# Patient Record
Sex: Female | Born: 1960
Health system: Southern US, Community
[De-identification: ages and names within clinical notes are randomized; demographics above are authoritative.]

---

## 1999-01-17 ENCOUNTER — Other Ambulatory Visit: Admission: RE | Admit: 1999-01-17 | Discharge: 1999-01-17 | Payer: Self-pay | Admitting: Gynecology

## 1999-01-23 ENCOUNTER — Ambulatory Visit: Admission: RE | Admit: 1999-01-23 | Discharge: 1999-01-23 | Payer: Self-pay | Admitting: Internal Medicine

## 1999-05-17 ENCOUNTER — Ambulatory Visit (HOSPITAL_BASED_OUTPATIENT_CLINIC_OR_DEPARTMENT_OTHER): Admission: RE | Admit: 1999-05-17 | Discharge: 1999-05-17 | Payer: Self-pay | Admitting: Otolaryngology

## 2000-12-06 ENCOUNTER — Encounter: Payer: Self-pay | Admitting: Gynecology

## 2000-12-06 ENCOUNTER — Encounter: Admission: RE | Admit: 2000-12-06 | Discharge: 2000-12-06 | Payer: Self-pay | Admitting: Gynecology

## 2000-12-11 ENCOUNTER — Encounter: Admission: RE | Admit: 2000-12-11 | Discharge: 2000-12-11 | Payer: Self-pay | Admitting: Gynecology

## 2000-12-11 ENCOUNTER — Encounter: Payer: Self-pay | Admitting: Gynecology

## 2001-06-05 ENCOUNTER — Encounter: Payer: Self-pay | Admitting: Gynecology

## 2001-06-05 ENCOUNTER — Encounter: Admission: RE | Admit: 2001-06-05 | Discharge: 2001-06-05 | Payer: Self-pay | Admitting: Gynecology

## 2001-10-08 ENCOUNTER — Other Ambulatory Visit: Admission: RE | Admit: 2001-10-08 | Discharge: 2001-10-08 | Payer: Self-pay | Admitting: Gynecology

## 2001-12-09 ENCOUNTER — Encounter: Payer: Self-pay | Admitting: Gynecology

## 2001-12-09 ENCOUNTER — Encounter: Admission: RE | Admit: 2001-12-09 | Discharge: 2001-12-09 | Payer: Self-pay | Admitting: Gynecology

## 2002-10-05 ENCOUNTER — Encounter: Payer: Self-pay | Admitting: Family Medicine

## 2002-10-05 ENCOUNTER — Encounter: Admission: RE | Admit: 2002-10-05 | Discharge: 2002-10-05 | Payer: Self-pay | Admitting: Family Medicine

## 2002-10-12 ENCOUNTER — Encounter (HOSPITAL_COMMUNITY): Admission: RE | Admit: 2002-10-12 | Discharge: 2003-01-10 | Payer: Self-pay | Admitting: General Surgery

## 2002-10-12 ENCOUNTER — Other Ambulatory Visit: Admission: RE | Admit: 2002-10-12 | Discharge: 2002-10-12 | Payer: Self-pay | Admitting: Gynecology

## 2002-10-13 ENCOUNTER — Encounter: Payer: Self-pay | Admitting: Endocrinology

## 2002-12-14 ENCOUNTER — Encounter: Payer: Self-pay | Admitting: Gynecology

## 2002-12-14 ENCOUNTER — Encounter: Admission: RE | Admit: 2002-12-14 | Discharge: 2002-12-14 | Payer: Self-pay | Admitting: Gynecology

## 2002-12-17 ENCOUNTER — Ambulatory Visit (HOSPITAL_COMMUNITY): Admission: RE | Admit: 2002-12-17 | Discharge: 2002-12-17 | Payer: Self-pay | Admitting: Endocrinology

## 2002-12-17 ENCOUNTER — Encounter: Payer: Self-pay | Admitting: Endocrinology

## 2003-09-14 ENCOUNTER — Encounter (INDEPENDENT_AMBULATORY_CARE_PROVIDER_SITE_OTHER): Payer: Self-pay | Admitting: Specialist

## 2003-09-14 ENCOUNTER — Observation Stay (HOSPITAL_COMMUNITY): Admission: RE | Admit: 2003-09-14 | Discharge: 2003-09-15 | Payer: Self-pay | Admitting: Gynecology

## 2003-12-16 ENCOUNTER — Encounter: Admission: RE | Admit: 2003-12-16 | Discharge: 2003-12-16 | Payer: Self-pay | Admitting: Gynecology

## 2003-12-22 ENCOUNTER — Encounter: Admission: RE | Admit: 2003-12-22 | Discharge: 2003-12-22 | Payer: Self-pay | Admitting: Gynecology

## 2005-01-02 ENCOUNTER — Other Ambulatory Visit: Admission: RE | Admit: 2005-01-02 | Discharge: 2005-01-02 | Payer: Self-pay | Admitting: Gynecology

## 2005-01-08 ENCOUNTER — Encounter: Admission: RE | Admit: 2005-01-08 | Discharge: 2005-01-08 | Payer: Self-pay | Admitting: Gynecology

## 2005-01-09 ENCOUNTER — Encounter: Admission: RE | Admit: 2005-01-09 | Discharge: 2005-01-09 | Payer: Self-pay | Admitting: Family Medicine

## 2006-01-22 ENCOUNTER — Encounter: Admission: RE | Admit: 2006-01-22 | Discharge: 2006-01-22 | Payer: Self-pay | Admitting: Gynecology

## 2006-02-14 ENCOUNTER — Other Ambulatory Visit: Admission: RE | Admit: 2006-02-14 | Discharge: 2006-02-14 | Payer: Self-pay | Admitting: Gynecology

## 2007-02-04 ENCOUNTER — Encounter: Admission: RE | Admit: 2007-02-04 | Discharge: 2007-02-04 | Payer: Self-pay | Admitting: Gynecology

## 2007-02-24 ENCOUNTER — Other Ambulatory Visit: Admission: RE | Admit: 2007-02-24 | Discharge: 2007-02-24 | Payer: Self-pay | Admitting: Gynecology

## 2008-02-05 ENCOUNTER — Encounter: Admission: RE | Admit: 2008-02-05 | Discharge: 2008-02-05 | Payer: Self-pay | Admitting: Gynecology

## 2009-02-22 ENCOUNTER — Encounter: Admission: RE | Admit: 2009-02-22 | Discharge: 2009-02-22 | Payer: Self-pay | Admitting: Gynecology

## 2010-01-20 ENCOUNTER — Encounter: Payer: Self-pay | Admitting: Family Medicine

## 2010-03-15 ENCOUNTER — Ambulatory Visit: Payer: Self-pay | Admitting: Vascular Surgery

## 2010-03-23 ENCOUNTER — Encounter: Admission: RE | Admit: 2010-03-23 | Discharge: 2010-03-23 | Payer: Self-pay | Admitting: Gynecology

## 2010-03-29 ENCOUNTER — Encounter: Admission: RE | Admit: 2010-03-29 | Discharge: 2010-03-29 | Payer: Self-pay | Admitting: Gynecology

## 2011-03-06 NOTE — Consult Note (Signed)
NEW PATIENT CONSULTATION   [name], [name]  DOB:  [birth year]                                       03/15/2010  ZOXWR#:[id number]   The patient presents today for evaluation of lower extremity venous  pathology.  She has significant telangiectasia and reticular veins  bilaterally, more so on her right leg than her left.  This is mostly in  the lateral thighs.  She reports that she has discomfort over these  areas with throbbing and dull ache over these areas as well.  She also  reports swelling in her calves and ankles with prolonged standing.  She  has worn compression garments since January of 2011 with mild relief.  She does not have any history of deep venous thrombosis or bleeding from  the varices and no history of superficial thrombophlebitis.  She reports  that the pain is present with or without standing or sitting.   Her past medical history is significant for prior tonsillectomy, C-  section, turbinate reduction and hysterectomy in 2004.  She does have  history of hypertension.   She has severe allergy to lisinopril.   Family history is negative for premature atherosclerotic disease.   SOCIAL HISTORY:  She is married with two children.  She is an Designer, jewellery.  She does not smoke or drink alcohol.   REVIEW OF SYSTEMS:  She has no weight loss or weight gain.  Her weight  is 225 pounds.  She is 5 feet 9 inches tall.  Remaining review of  systems is negative as documented in her chart.   PHYSICAL EXAM:  General:  A well-developed, well-nourished white female  in no acute distress.  HEENT:  Normal.  Her radial and dorsalis pedis  pulses are 2+ bilaterally.  Musculoskeletal:  Shows no major deformities  or cyanosis.  Neurological:  No focal weakness or paresthesias.  Skin:  Without ulcers or rashes.  She does not have any prominent varicose  veins.  She does have reticular veins and spider vein most prominently  in her lateral thighs.   She  underwent noninvasive vascular laboratory studies in our office and  I reviewed this with her.  This shows no evidence of deep venous  thrombosis.  No evidence of valvular incompetence in her deep or  superficial veins.  I discussed this at length with the patient and  explained to her that she does not have any serious venous pathology  that would require treatment.  I explained that her leg aching may or  may not be related to the reticular veins and telangiectasia.  I  explained that the treatment for these would be sclerotherapy and this  would normally be more for cosmesis.  This could be related to the  discomfort and she may achieve a side benefit from this as well.  Plan  to see her on an as-needed basis and she will notify us should she wish  to pursue sclerotherapy.     Larina Earthly, M.D.  Electronically Signed   TFE/MEDQ  D:  03/15/2010  T:  03/16/2010  Job:  [id number]   cc:   Molly Maduro L. Foy [place], M.D.  Katrina Stack, NP

## 2011-03-06 NOTE — Procedures (Signed)
LOWER EXTREMITY VENOUS REFLUX EXAM   INDICATION:  Right lower extremity varicose veins.   EXAM:  Using color-flow imaging and pulse Doppler spectral analysis, the  right common femoral, superficial femoral, popliteal, posterior tibial,  greater and lesser saphenous veins are evaluated.  There is no evidence  suggesting deep venous insufficiency in the right lower extremity.   The right saphenofemoral junction is competent. The right GSV is  competent with the caliber as described below.   The right proximal short saphenous vein demonstrates competency.   There are no diameters.   GSV Diameter (used if found to be incompetent only)                                            Right    Left  Proximal Greater Saphenous Vein           cm       cm  Proximal-to-mid-thigh                     cm       cm  Mid thigh                                 cm       cm  Mid-distal thigh                          cm       cm  Distal thigh                              cm       cm  Knee                                      cm       cm   IMPRESSION:  1. The right greater saphenous vein is competent.  2. The right greater saphenous vein is not aneurysmal.  3. The right greater saphenous vein is not tortuous.  4. The deep venous system is competent.  5. The right lesser saphenous vein is competent.        ___________________________________________  Larina Earthly, M.D.   NT/MEDQ  D:  03/15/2010  T:  03/15/2010  Job:  [id number]

## 2011-03-09 NOTE — Op Note (Signed)
NAME:  [name], [name]                          ACCOUNT NO.:  [id number]   MEDICAL RECORD NO.:  [id number]                   PATIENT TYPE:  OBV   LOCATION:  9399                                 FACILITY:  [facility]   PHYSICIAN:  Gretta Cool, M.D.              DATE OF BIRTH:  1961-05-11   DATE OF PROCEDURE:  09/14/2003  DATE OF DISCHARGE:                                 OPERATIVE REPORT   PREOPERATIVE DIAGNOSES:  1. Abnormal uterine bleeding with multiple uterine leiomyomata and     endometrial polyps.  2. Menstrual migraine, incapacitating.   POSTOPERATIVE DIAGNOSES:  1. Abnormal uterine bleeding with multiple uterine leiomyomata and     endometrial polyps.  2. Menstrual migraine, incapacitating.   PROCEDURE:  1. Laparoscopy assisted vaginal hysterectomy.  2. Bilateral salpingo-oophorectomy.   SURGEON:  Gretta Cool, M.D.   ASSISTANT:  Raynald Kemp, M.D.   ANESTHESIA:  General orotracheal.   DESCRIPTION OF PROCEDURE:  Under excellent anesthesia as above with the  patient prepped and draped in Allen stirrups with a Foley catheter draining  her bladder, a subumbilical incision was made and Veress cannula introduced.  After adequate pneumoperitoneum with __________, laparoscopic trocar was  introduced and pelvic organs visualized.  The fallopian tubes and ovaries  appeared normal.  There was minimal endometriosis in the cul-de-sac treated  by cautery.  No evidence of ___________ disease anywhere else.  There were  multiple fibroids distorting the endometrial cavity and the uterus.  At this  point attention was turned to the laparoscopic port placement.  Two port  sites were placed laterally under direct vision with careful avoidance of  major structures.  A tripolar forceps 5 mm was introduced and the  infundibulopelvic vessels coagulated and transected.  The pedicles were then  progressively transected all the way to the level of the uterine vessels.  The bladder was not  significantly advanced from her previous cesarean  section.  There were no adhesions from her section.  At this point attention  was turned to the vaginal portion of the procedure.  The patient was  repositioned and cervix grasped with a single tooth tenaculum.  A weighted  speculum was placed and the cervical mucosa infiltrated with Xylocaine 1%  with 1:200 epinephrine.  The mucosa was then incised and pushed off the  lower segment.  The cul-de-sac was then entered.  The uterosacral and then  cardinal ligaments were progressively clamped, cut, sutured, and tied with 0  Vicryl.  They were tagged for later identification.  The uterine vessels  were then skeletonized, bladder peritoneum opened, and the uterine vessels  clamped, cut, sutured, and tied with 0 Vicryl.  At this point the upper  portion of the pedicle was communicated to the lower dissection and the  uterus inverted and removed along with tubes and ovaries.  At this point the  peritoneum was closed with a running purse-string  closure of 0 Monocryl.  A  cardinal uterosacral colposuspension was then performed using 0 Novofil.  The cuff was then approximated with a running subcuticular closure of 2-0  Vicryl.  At this point the procedure was terminated without complications.  The pedicles were reexamined abdominally and the pelvic floor irrigated with  lactated Ringer's.  The pedicles were all dry.  At this  point the gas was allowed to escape and the incision closed with deep suture  of 5-0 Dexon and skin closure with Steri-Strips.  At the end of the  procedure the sponge and lap counts were correct.  There were no  complications.  The patient returned to recovery room in excellent  condition.                                               Gretta Cool, M.D.    CWL/MEDQ  D:  09/14/2003  T:  09/14/2003  Job:  [id number]   cc:   Izola Price, M.D.  [employer] [employer]

## 2011-03-09 NOTE — Discharge Summary (Signed)
NAME:  [name], LOUGHMILLER                          ACCOUNT NO.:  [id number]   MEDICAL RECORD NO.:  [id number]                   PATIENT TYPE:  OBV   LOCATION:  9312                                 FACILITY:  [facility]   PHYSICIAN:  Gretta Cool, M.D.              DATE OF BIRTH:  01/07/1961   DATE OF ADMISSION:  09/14/2003  DATE OF DISCHARGE:  09/15/2003                                 DISCHARGE SUMMARY   HISTORY OF PRESENT ILLNESS:  Ms. Riggle is a 50 year old white female,  gravida 2 para 3 with a C-section for twins.  She presented to our office  with abnormal uterine bleeding and polyps by ultrasound.  She also has  severe menstrual migraines and wishes definitive therapy by hysterectomy and  BSO.   PHYSICAL EXAMINATION:  HEART:  Rate and rhythm are regular without murmur,  gallop, cardiac enlargement.  LUNGS:  Bilaterally clear to A&P.  ABDOMEN:  Large panniculus without palpable masses or organomegaly.  GENITALIA:  External genitalia within normal limits for female.  PELVIC:  Clean and rugose.  Cervix is parous and clean.  Uterus upper normal  limits in size.  Adnexa bilaterally clear.  Rectovaginal exam confirms.   IMPRESSION:  1. Abnormal uterine bleeding.  2. Menstrual migraines.  3. Polyps confirmed on ultrasound - endometrial polyps.   PLAN:  LAVH-BSO, possible TAH-BSO.   LABORATORY DATA:  EKG:  Normal sinus rhythm, heart rate of 73.   Admission hemoglobin 14.1, hematocrit 4.1. and white count of 7.0.  On the  first postoperative day hemoglobin was 12.2, hematocrit 35.3.  The remainder  of her preoperative lab work was within normal limits.  Serum pregnancy test  was negative.   HOSPITAL COURSE:  The patient underwent laparoscopic assisted vaginal  hysterectomy, bilateral salpingo-oophorectomy under general anesthesia.  The  procedures were completed without any complications, and the patient was  returned to the recovery room in excellent condition.   Pathology  report revealed benign cervix with colonic cervicitis, benign  proliferative phase endometrium, leiomyoma and focal adenomyosis.  Bilateral  ovaries and tubes with benign hemorrhagic follicular cysts bilaterally.   POSTOPERATIVE COURSE:  Without complication and the patient was discharged  on the first postoperative day in excellent condition.   FINAL DISCHARGE INSTRUCTIONS:  Included:  1. No heavy lifting or straining.  2. No vaginal entrance.  3. Increase ambulation as tolerated.  4. She is to call for any fever of over 100.5 or failure of daily     improvement.   DIET:  Regular.   MEDICATIONS:  1. Tylox one p.o. q.4h. p.r.n. discomfort.  2. Bextra 10 mg p.o. daily for less severe pain.   She is to return to the office in one week for followup.   CONDITION ON DISCHARGE:  Excellent.   FINAL DISCHARGE DIAGNOSIS:  1. Abnormal uterine bleeding with multiple uterine leiomyomata and     endometrial polyps.  2. Menstrual migraines, incapacitating.   PROCEDURES PERFORMED:  1. Laparoscopic assisted vaginal hysterectomy, under general anesthesia.  2. Bilateral salpingo-oophorectomy, under general anesthesia.     Matt Holmes, N.P.                          Gretta Cool, M.D.    EMK/MEDQ  D:  10/12/2003  T:  10/12/2003  Job:  [id number]

## 2011-04-09 ENCOUNTER — Other Ambulatory Visit: Payer: Self-pay | Admitting: Gynecology

## 2011-04-09 DIAGNOSIS — Z1231 Encounter for screening mammogram for malignant neoplasm of breast: Secondary | ICD-10-CM

## 2011-04-18 ENCOUNTER — Ambulatory Visit: Payer: Self-pay

## 2011-04-26 ENCOUNTER — Ambulatory Visit
Admission: RE | Admit: 2011-04-26 | Discharge: 2011-04-26 | Disposition: A | Discharge status: Home / Self Care | Payer: Commercial Managed Care - PPO | Source: Ambulatory Visit | Attending: Gynecology | Admitting: Gynecology

## 2011-04-26 DIAGNOSIS — Z1231 Encounter for screening mammogram for malignant neoplasm of breast: Secondary | ICD-10-CM

## 2011-06-13 ENCOUNTER — Other Ambulatory Visit: Payer: Self-pay | Admitting: Gynecology

## 2011-08-06 IMAGING — MG MM DIGITAL SCREENING
4 series · 4 of 4 positions shown · non-contrast
Comparison: none

DG SCREEN MAMMOGRAM BILATERAL
Bilateral CC and MLO view(s) were taken.

DIGITAL SCREENING MAMMOGRAM WITH CAD:
There are scattered fibroglandular densities.  A possible mass is noted in the left breast.  Spot 
compression views and possibly sonography are recommended for further evaluation.  In the right 
breast, no masses or malignant type calcifications are identified.  Compared with prior studies.
Images were processed with CAD.

[R CC]
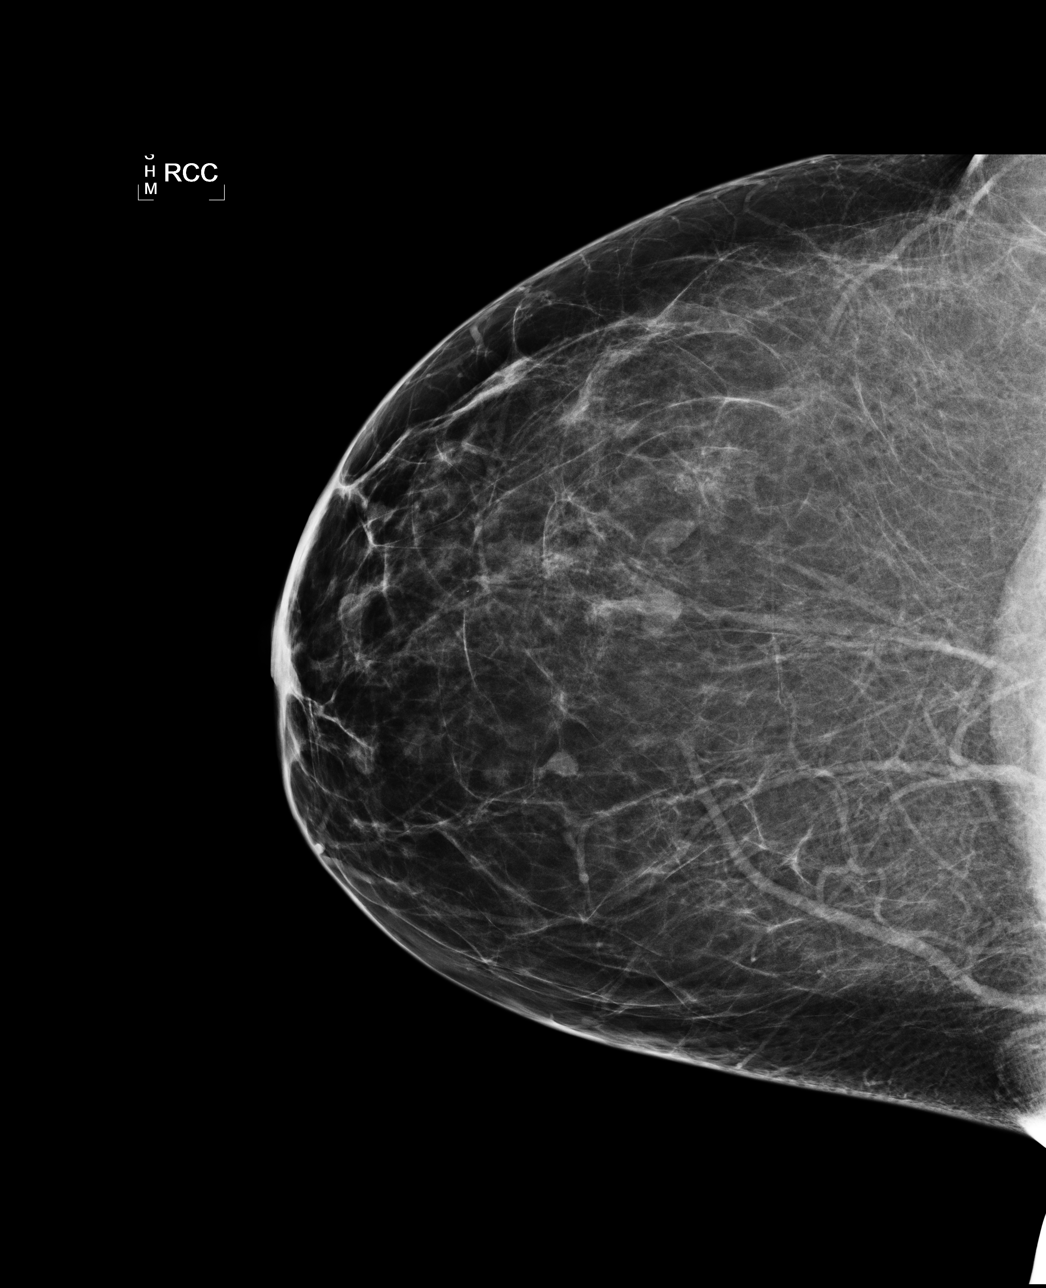

[L CC]
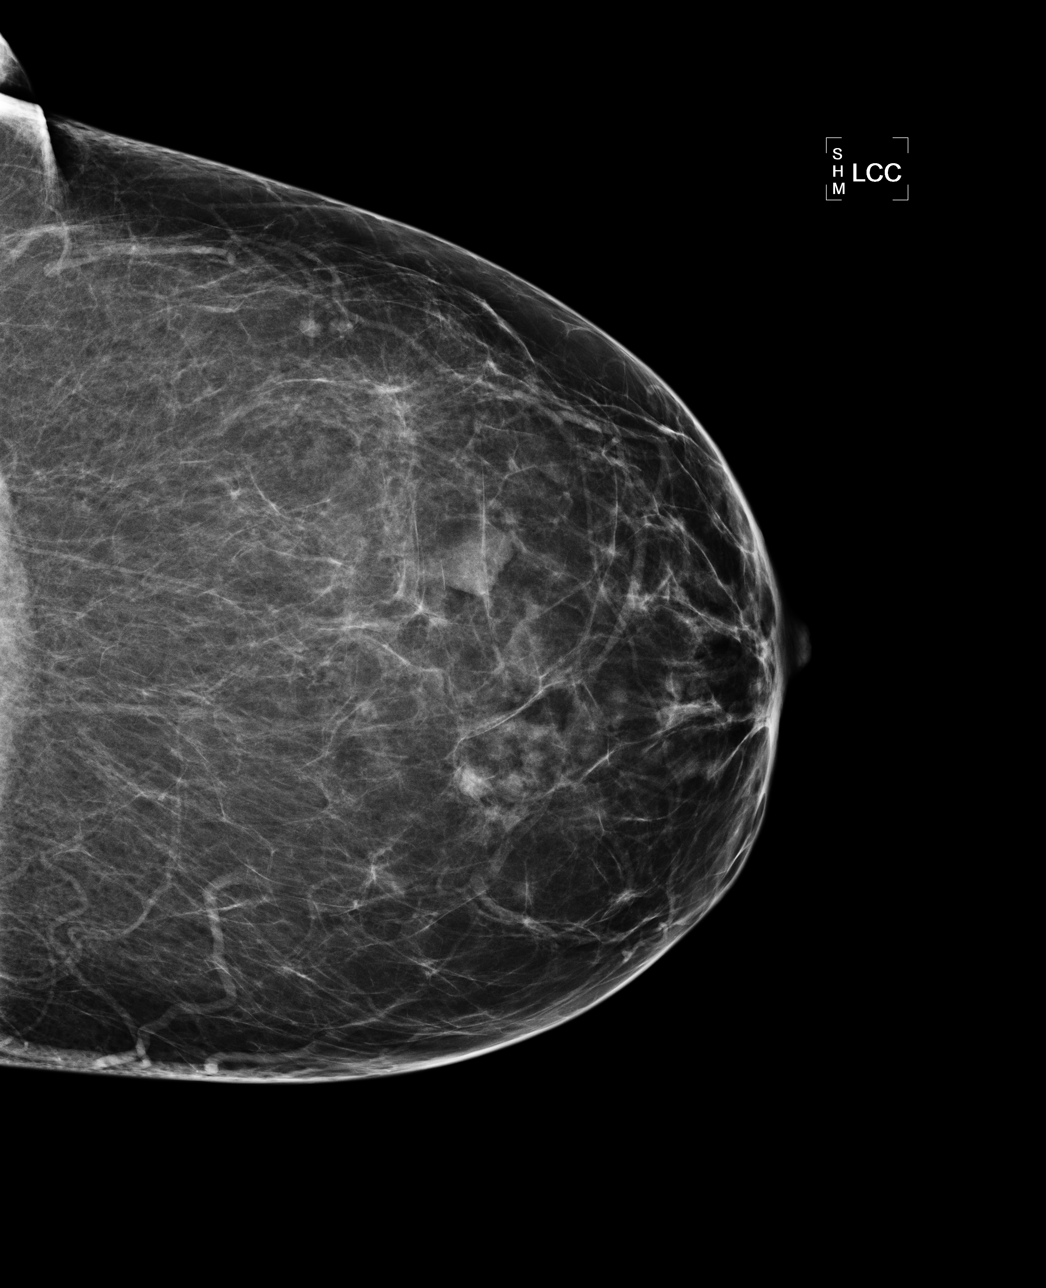

[L MLO]
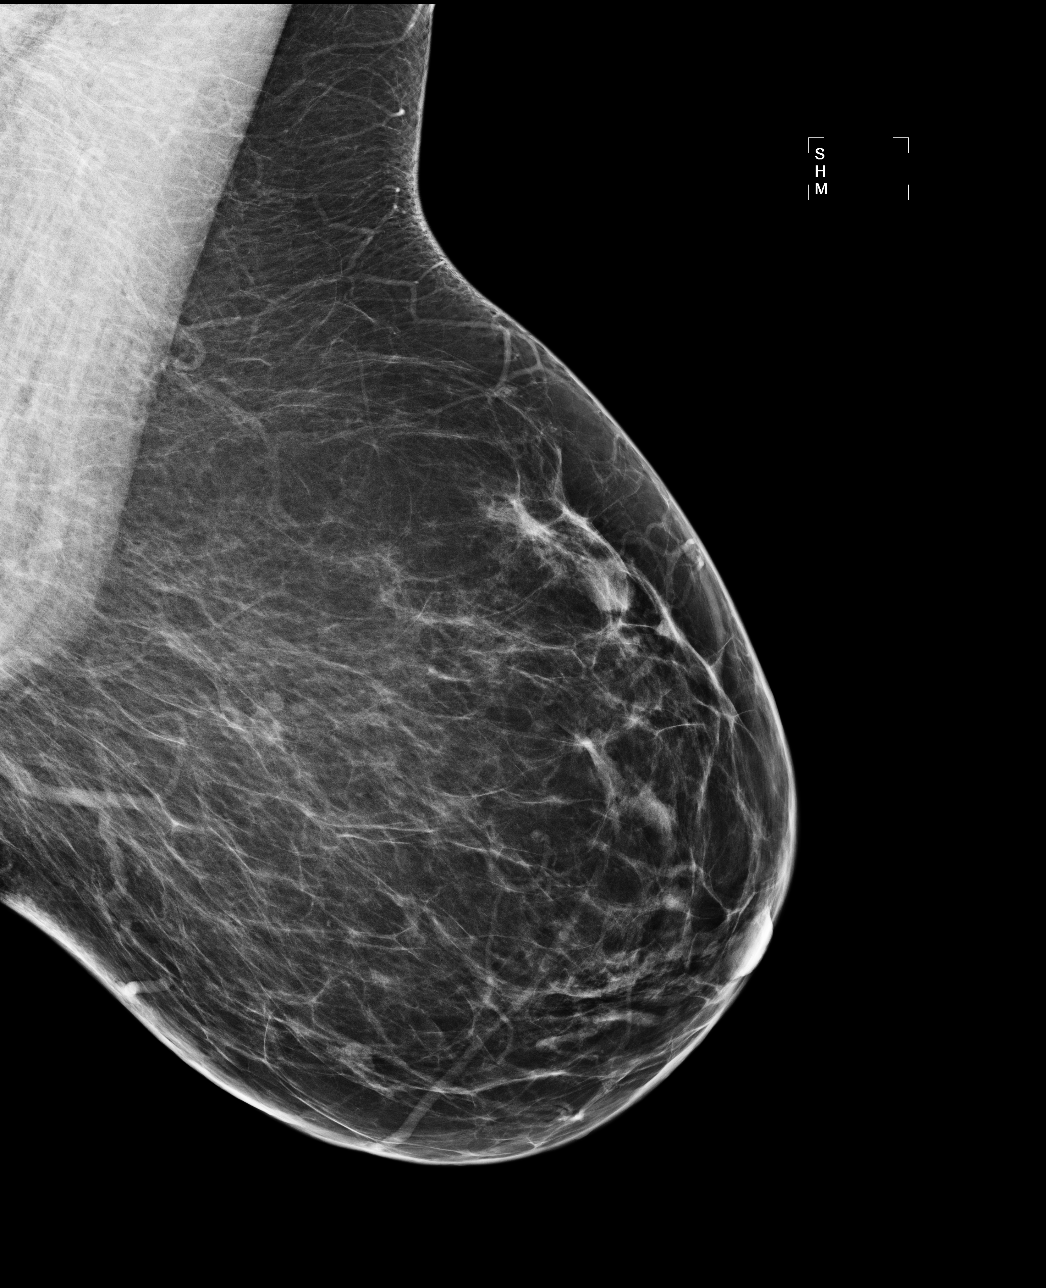

[R MLO]
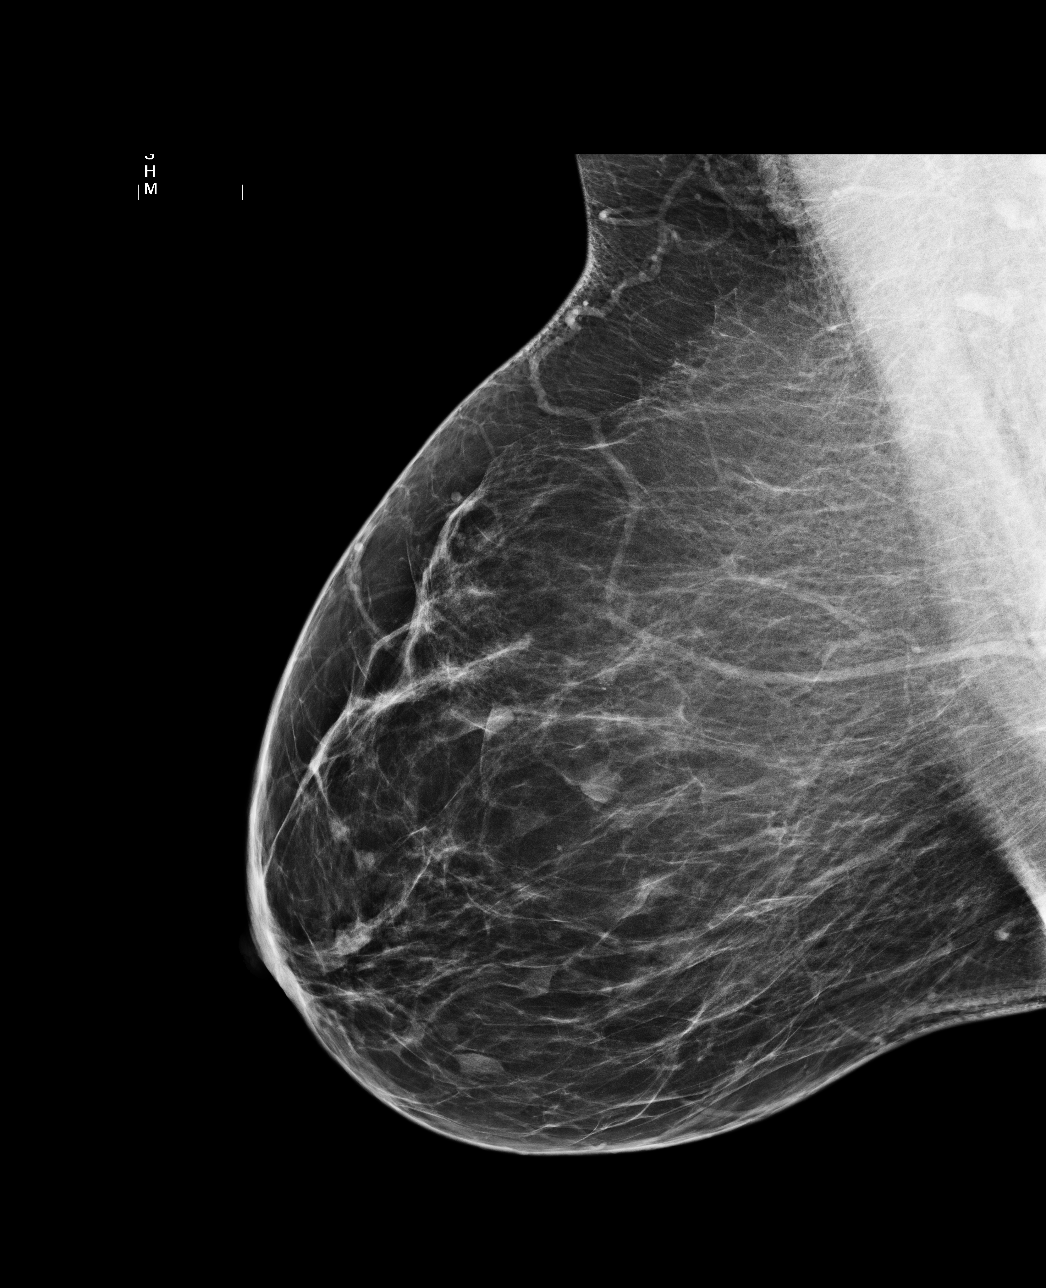

[4 of 4 positions shown; findings below may reference images not displayed]

IMPRESSION: Possible mass, left breast.  Additional evaluation is indicated.  The patient will be contacted for
additional studies and a supplementary report will follow.  No specific mammographic evidence of 
malignancy, right breast.

ASSESSMENT: Need additional imaging evaluation and/or prior mammograms for comparison - BI-RADS 0

Further imaging of the left breast.
,

## 2012-05-16 ENCOUNTER — Other Ambulatory Visit: Payer: Self-pay | Admitting: Gynecology

## 2012-05-16 DIAGNOSIS — Z1231 Encounter for screening mammogram for malignant neoplasm of breast: Secondary | ICD-10-CM

## 2012-05-26 ENCOUNTER — Ambulatory Visit: Payer: Commercial Managed Care - PPO

## 2012-06-09 ENCOUNTER — Ambulatory Visit
Admission: RE | Admit: 2012-06-09 | Discharge: 2012-06-09 | Disposition: A | Discharge status: Home / Self Care | Payer: Commercial Managed Care - PPO | Source: Ambulatory Visit | Attending: Gynecology | Admitting: Gynecology

## 2012-06-09 DIAGNOSIS — Z1231 Encounter for screening mammogram for malignant neoplasm of breast: Secondary | ICD-10-CM

## 2012-06-17 ENCOUNTER — Other Ambulatory Visit: Payer: Self-pay | Admitting: Gynecology

## 2013-06-03 ENCOUNTER — Other Ambulatory Visit: Payer: Self-pay

## 2013-06-03 DIAGNOSIS — Z1231 Encounter for screening mammogram for malignant neoplasm of breast: Secondary | ICD-10-CM

## 2013-06-16 ENCOUNTER — Ambulatory Visit
Admission: RE | Admit: 2013-06-16 | Discharge: 2013-06-16 | Disposition: A | Discharge status: Home / Self Care | Payer: 59 | Source: Ambulatory Visit

## 2013-06-16 DIAGNOSIS — Z1231 Encounter for screening mammogram for malignant neoplasm of breast: Secondary | ICD-10-CM

## 2013-10-24 IMAGING — MG MM DIGITAL SCREENING BILAT
8 series · 8 of 24 positions shown · non-contrast
Comparison: Previous exams.

CLINICAL DATA: Screening.

DIGITAL BILATERAL SCREENING MAMMOGRAM WITH CAD
 DIGITAL BREAST TOMOSYNTHESIS
Digital breast tomosynthesis images are acquired in two
projections.  These images are reviewed in combination with the
digital mammogram, confirming the findings below.

[R MLO]
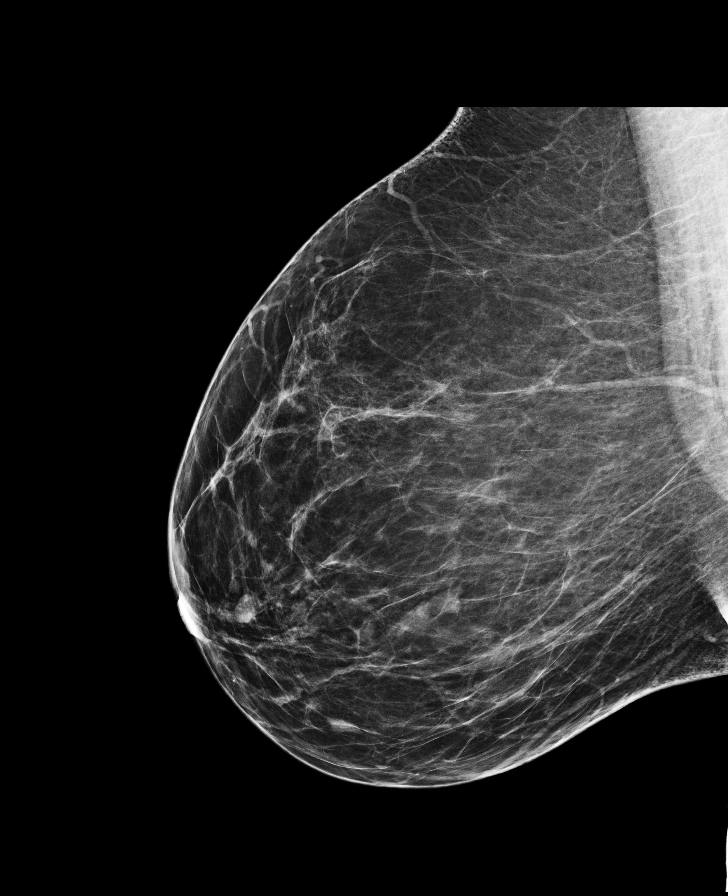

[L MLO]
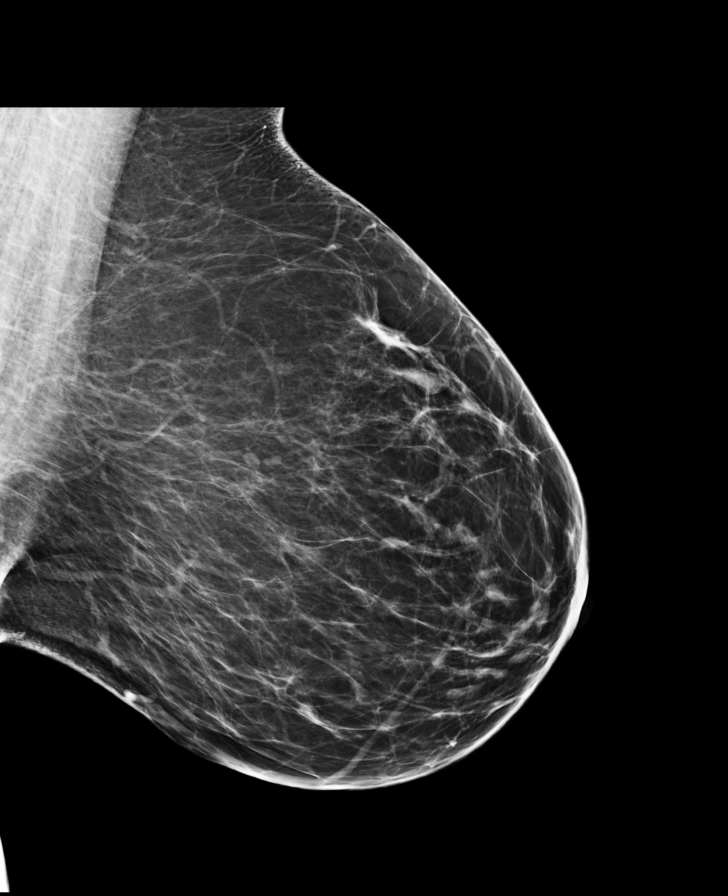

[R CC]
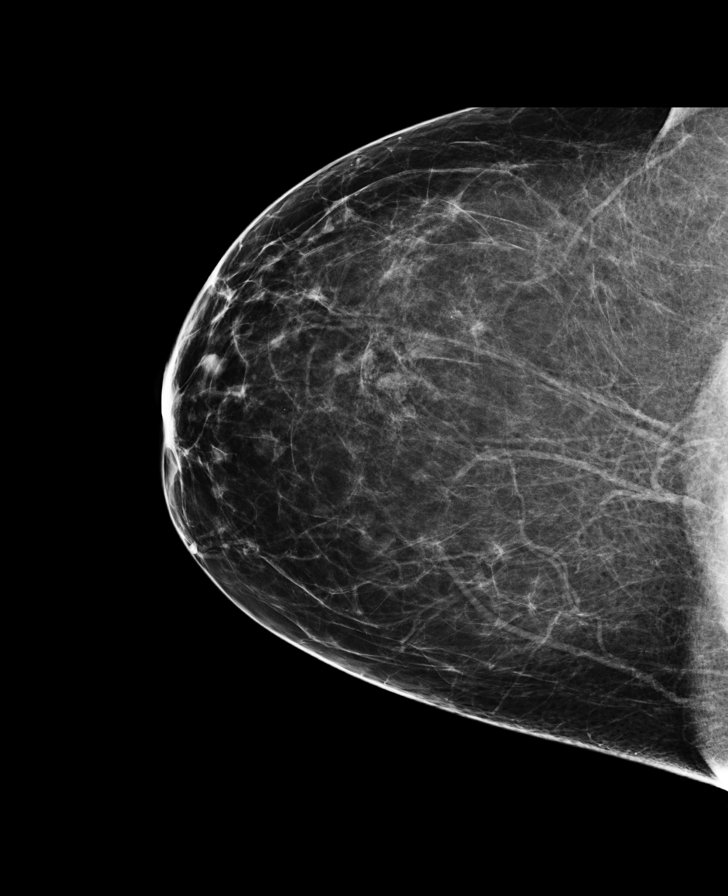

[L CC]
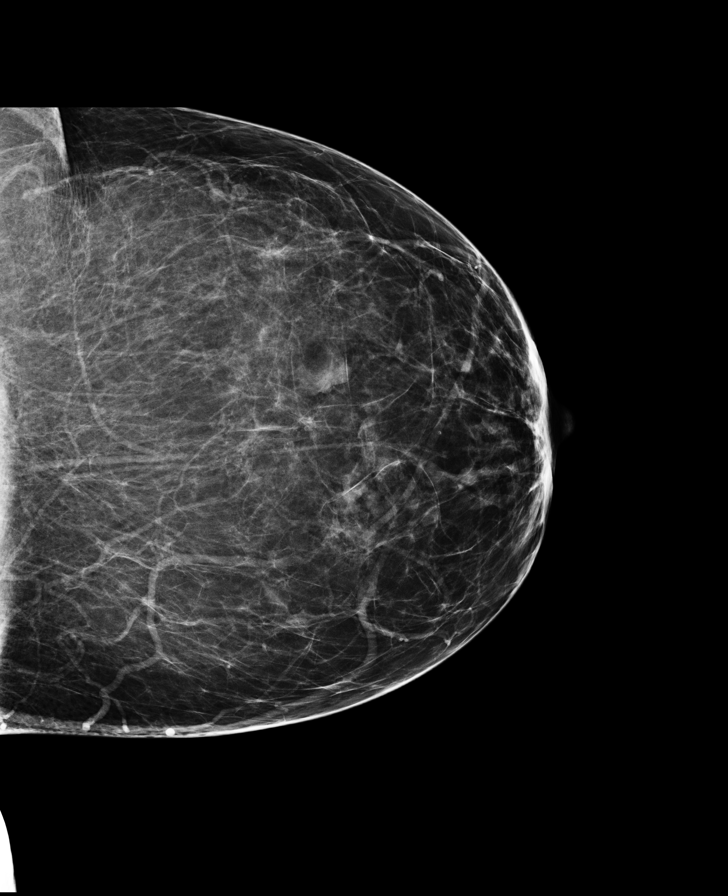

[LCC COMBO BREAST TOMOSYNTHESIS IMAGE tomo · tomo slice 37/74.0]
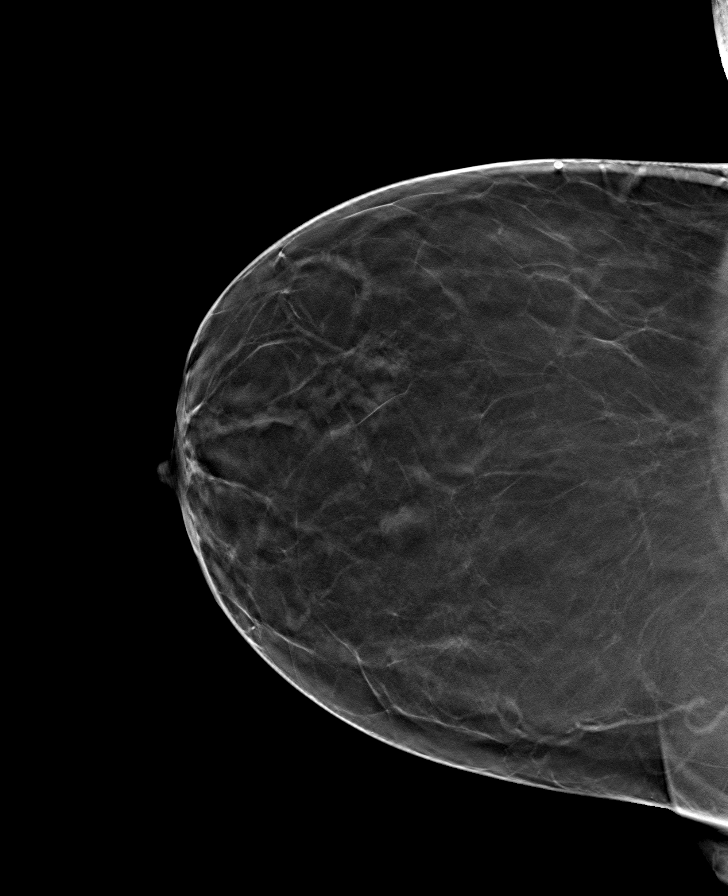

[RMLO COMBO BREAST TOMOSYNTHESIS IMAGE tomo · tomo slice 38/75.0]
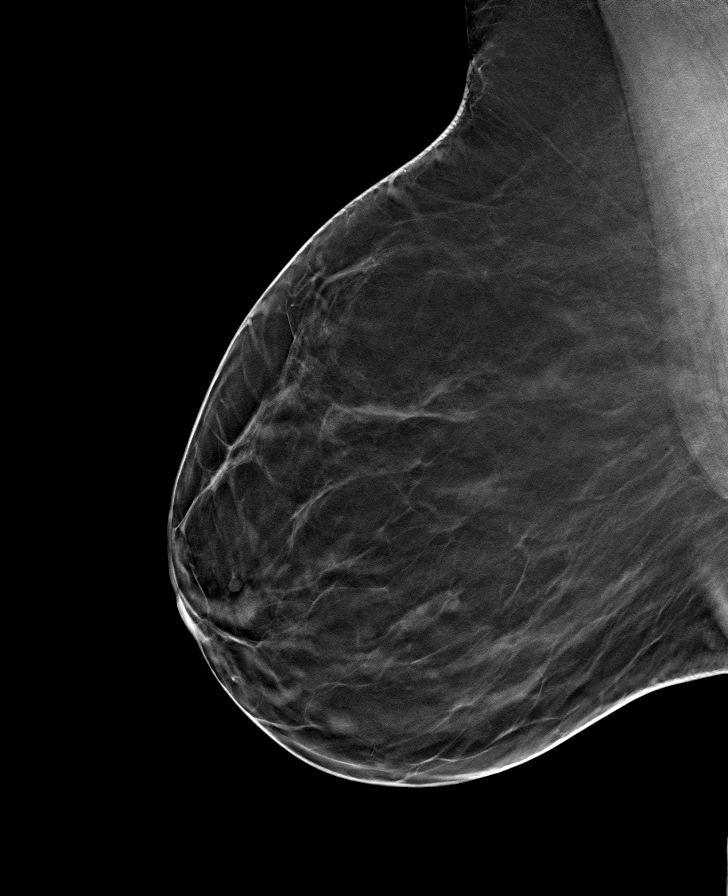

[RCC COMBO BREAST TOMOSYNTHESIS IMAGE tomo · tomo slice 37/74.0]
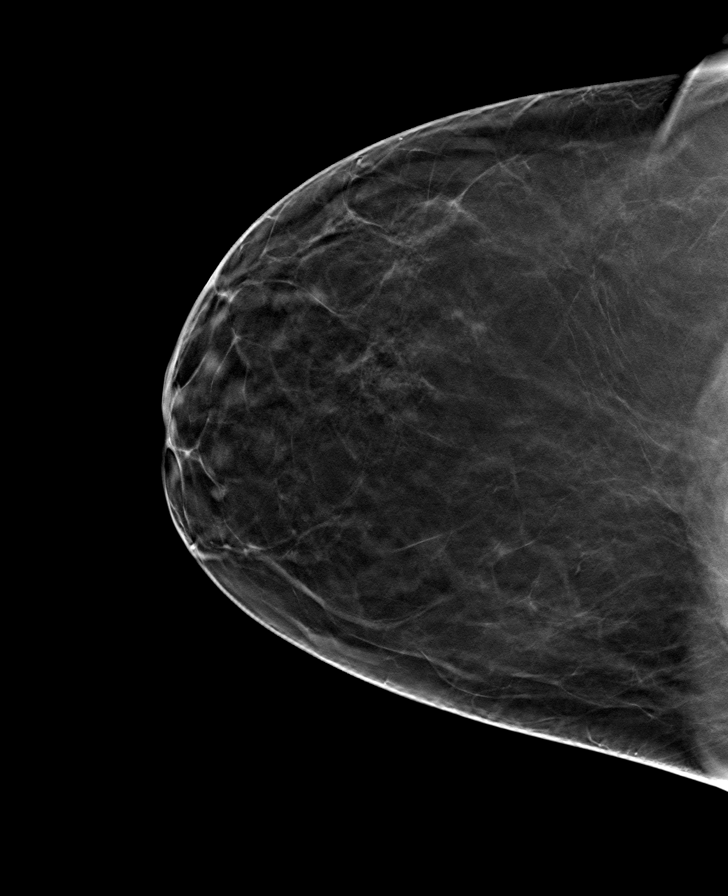

[LMLO COMBO BREAST TOMOSYNTHESIS IMAGE tomo · tomo slice 41/81.0]
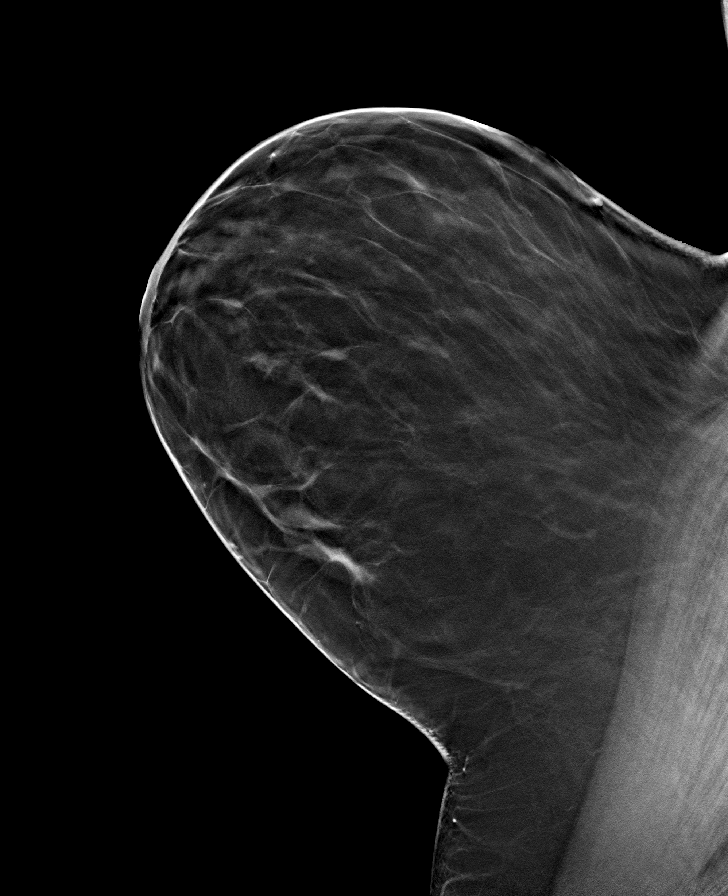

[8 of 24 positions shown; findings below may reference images not displayed]

FINDINGS: There are scattered fibroglandular densities. No
suspicious masses, architectural distortion, or calcifications are
present.

Images were processed with CAD.
IMPRESSION: No mammographic evidence of malignancy.

A result letter of this screening mammogram will be mailed directly
to the patient.

RECOMMENDATION:
Screening mammogram in one year. (Code:PF-R-ARQ)

BI-RADS CATEGORY 2:  Benign finding(s).

## 2014-06-09 ENCOUNTER — Other Ambulatory Visit: Payer: Self-pay

## 2014-06-09 DIAGNOSIS — Z1231 Encounter for screening mammogram for malignant neoplasm of breast: Secondary | ICD-10-CM

## 2014-06-21 ENCOUNTER — Ambulatory Visit
Admission: RE | Admit: 2014-06-21 | Discharge: 2014-06-21 | Disposition: A | Discharge status: Home / Self Care | Payer: Commercial Managed Care - PPO | Source: Ambulatory Visit

## 2014-06-21 DIAGNOSIS — Z1231 Encounter for screening mammogram for malignant neoplasm of breast: Secondary | ICD-10-CM

## 2016-07-26 ENCOUNTER — Other Ambulatory Visit: Payer: Self-pay | Admitting: Gynecology

## 2016-07-26 DIAGNOSIS — Z1231 Encounter for screening mammogram for malignant neoplasm of breast: Secondary | ICD-10-CM

## 2016-08-30 ENCOUNTER — Ambulatory Visit
Admission: RE | Admit: 2016-08-30 | Discharge: 2016-08-30 | Disposition: A | Discharge status: Home / Self Care | Payer: Commercial Managed Care - PPO | Source: Ambulatory Visit | Attending: Gynecology | Admitting: Gynecology

## 2016-08-30 DIAGNOSIS — Z1231 Encounter for screening mammogram for malignant neoplasm of breast: Secondary | ICD-10-CM

## 2016-10-24 DIAGNOSIS — H9313 Tinnitus, bilateral: Secondary | ICD-10-CM | POA: Diagnosis not present

## 2016-10-24 DIAGNOSIS — J329 Chronic sinusitis, unspecified: Secondary | ICD-10-CM | POA: Diagnosis not present

## 2016-10-24 DIAGNOSIS — H93293 Other abnormal auditory perceptions, bilateral: Secondary | ICD-10-CM | POA: Diagnosis not present

## 2016-11-14 DIAGNOSIS — J019 Acute sinusitis, unspecified: Secondary | ICD-10-CM | POA: Diagnosis not present

## 2016-11-14 DIAGNOSIS — J209 Acute bronchitis, unspecified: Secondary | ICD-10-CM | POA: Diagnosis not present

## 2016-11-19 DIAGNOSIS — R05 Cough: Secondary | ICD-10-CM | POA: Diagnosis not present

## 2016-11-19 DIAGNOSIS — R062 Wheezing: Secondary | ICD-10-CM | POA: Diagnosis not present

## 2016-11-19 DIAGNOSIS — J203 Acute bronchitis due to coxsackievirus: Secondary | ICD-10-CM | POA: Diagnosis not present

## 2016-12-05 DIAGNOSIS — Z888 Allergy status to other drugs, medicaments and biological substances status: Secondary | ICD-10-CM | POA: Diagnosis not present

## 2016-12-05 DIAGNOSIS — R490 Dysphonia: Secondary | ICD-10-CM | POA: Diagnosis not present

## 2016-12-05 DIAGNOSIS — J385 Laryngeal spasm: Secondary | ICD-10-CM | POA: Diagnosis not present

## 2016-12-19 DIAGNOSIS — R490 Dysphonia: Secondary | ICD-10-CM | POA: Diagnosis not present

## 2016-12-19 DIAGNOSIS — J385 Laryngeal spasm: Secondary | ICD-10-CM | POA: Diagnosis not present

## 2017-01-01 DIAGNOSIS — L821 Other seborrheic keratosis: Secondary | ICD-10-CM | POA: Diagnosis not present

## 2017-01-01 DIAGNOSIS — D225 Melanocytic nevi of trunk: Secondary | ICD-10-CM | POA: Diagnosis not present

## 2017-02-01 ENCOUNTER — Other Ambulatory Visit (INDEPENDENT_AMBULATORY_CARE_PROVIDER_SITE_OTHER): Payer: Self-pay

## 2017-02-01 DIAGNOSIS — M25561 Pain in right knee: Secondary | ICD-10-CM

## 2017-02-04 ENCOUNTER — Ambulatory Visit (INDEPENDENT_AMBULATORY_CARE_PROVIDER_SITE_OTHER): Payer: Commercial Managed Care - PPO | Admitting: Orthopaedic Surgery

## 2017-02-04 ENCOUNTER — Ambulatory Visit (HOSPITAL_BASED_OUTPATIENT_CLINIC_OR_DEPARTMENT_OTHER)
Admission: RE | Admit: 2017-02-04 | Discharge: 2017-02-04 | Disposition: A | Discharge status: Home / Self Care | Payer: Commercial Managed Care - PPO | Source: Ambulatory Visit | Attending: Orthopaedic Surgery | Admitting: Orthopaedic Surgery

## 2017-02-04 DIAGNOSIS — M25561 Pain in right knee: Secondary | ICD-10-CM

## 2017-02-04 DIAGNOSIS — M1711 Unilateral primary osteoarthritis, right knee: Secondary | ICD-10-CM | POA: Insufficient documentation

## 2017-02-04 MED ORDER — METHYLPREDNISOLONE ACETATE 40 MG/ML IJ SUSP
40.0000 mg | INTRAMUSCULAR | Status: AC | PRN
  Administered 2017-02-04: 40 mg via INTRA_ARTICULAR

## 2017-02-04 NOTE — Progress Notes (Signed)
Office Visit Note   Patient: [name]           Date of Birth: 1961-10-22           MRN: [id number] Visit Date: 02/04/2017              Requested by: No referring provider defined for this encounter. PCP: No PCP Per Patient   Assessment & Plan: Visit Diagnoses:  1. Acute pain of right knee     Plan: I do feel based on her signs and symptoms and clinical exam that there is a high suspicion for medial meniscal tear. I do want to try quad strengthening exercises right now and I talked her about a steroid injection she was which she was agreeable to. I explained the risks and benefits of an injections and why I would recommend this. We placed a injection in her knee without any difficulty. I'll see her back in 2 weeks see how she doing overall between rest, ice, heat, anti-inflammatories, quad training exercises and steroid injection.  Follow-Up Instructions: Return in about 2 weeks (around 02/18/2017).   Orders:  Orders Placed This Encounter  Procedures  . Large Joint Injection/Arthrocentesis   No orders of the defined types were placed in this encounter.     Procedures: Large Joint Inj Date/Time: 02/04/2017 9:02 AM Performed by: Mcarthur Rossetti Authorized by: Mcarthur Rossetti   Location:  Knee Site:  R knee Ultrasound Guidance: No   Fluoroscopic Guidance: No   Arthrogram: No   Medications:  40 mg methylPREDNISolone acetate 40 MG/ML     Clinical Data: No additional findings.   Subjective: No chief complaint on file. This is a very pleasant 56 year old female who is had no problems with her right knee until 2 weeks ago and sustained a twisting type of injury to her right knee in the kitchen. She points the medial joint line as source of her pain. She's been having some locking catching and pivoting activities is causing her a lot of problems with her right knee. She said it is swollen as well. She is seeking treatment for her knee today and that is  her biggest chief complaint is right knee pain.  HPI  Review of Systems She denies any headache, chest pain, shortness of breath, fever, chills, nausea, vomiting  Objective: Vital Signs: There were no vitals taken for this visit.  Physical Exam She is alert and oriented 3 and in no acute distress. She does walk with a slight limp. Ortho Exam Examination of her right knee does show a mild effusion. Her left knee hyperextends but her right knee does not and I believe this is due to the effusion and pain. She does have medial joint line tenderness and a slightly positive McMurray sign to the right medial compartment. She has good range of motion of the knee with no significant patellofemoral grinding. It is painful past 90 of flexion again that is at the medial joint line. Specialty Comments:  No specialty comments available.  Imaging: Dg Knee 1-2 Views Right  Result Date: 02/04/2017 CLINICAL DATA:  Diffuse right knee pain for 3 weeks. EXAM: RIGHT KNEE - 1-2 VIEW COMPARISON:  None. FINDINGS: No evidence of fracture, dislocation, or joint effusion. Tiny marginal osteophytes in the patellofemoral and medial compartments. Soft tissues are unremarkable. IMPRESSION: Minimal arthritic changes. Electronically Signed   By: Lorriane Shire M.D.   On: 02/04/2017 08:49     PMFS History: There are no active problems  to display for this patient.  No past medical history on file.  No family history on file.  No past surgical history on file. Social History   Occupational History  . Not on file.   Social History Main Topics  . Smoking status: Not on file  . Smokeless tobacco: Not on file  . Alcohol use Not on file  . Drug use: Unknown  . Sexual activity: Not on file

## 2017-02-18 ENCOUNTER — Ambulatory Visit (INDEPENDENT_AMBULATORY_CARE_PROVIDER_SITE_OTHER): Payer: Commercial Managed Care - PPO | Admitting: Orthopaedic Surgery

## 2017-04-12 DIAGNOSIS — Z86010 Personal history of colonic polyps: Secondary | ICD-10-CM | POA: Diagnosis not present

## 2017-04-12 DIAGNOSIS — Z86711 Personal history of pulmonary embolism: Secondary | ICD-10-CM | POA: Diagnosis not present

## 2017-04-12 DIAGNOSIS — D125 Benign neoplasm of sigmoid colon: Secondary | ICD-10-CM | POA: Diagnosis not present

## 2017-07-01 DIAGNOSIS — L820 Inflamed seborrheic keratosis: Secondary | ICD-10-CM | POA: Diagnosis not present

## 2017-07-01 DIAGNOSIS — D2271 Melanocytic nevi of right lower limb, including hip: Secondary | ICD-10-CM | POA: Diagnosis not present

## 2017-07-01 DIAGNOSIS — L281 Prurigo nodularis: Secondary | ICD-10-CM | POA: Diagnosis not present

## 2017-07-01 DIAGNOSIS — L570 Actinic keratosis: Secondary | ICD-10-CM | POA: Diagnosis not present

## 2017-07-09 DIAGNOSIS — G5603 Carpal tunnel syndrome, bilateral upper limbs: Secondary | ICD-10-CM | POA: Diagnosis not present

## 2017-07-23 DIAGNOSIS — R29898 Other symptoms and signs involving the musculoskeletal system: Secondary | ICD-10-CM | POA: Diagnosis not present

## 2017-07-23 DIAGNOSIS — R200 Anesthesia of skin: Secondary | ICD-10-CM | POA: Diagnosis not present

## 2017-07-23 DIAGNOSIS — G5601 Carpal tunnel syndrome, right upper limb: Secondary | ICD-10-CM | POA: Diagnosis not present

## 2017-08-06 DIAGNOSIS — G5601 Carpal tunnel syndrome, right upper limb: Secondary | ICD-10-CM | POA: Diagnosis not present

## 2017-09-11 DIAGNOSIS — G5601 Carpal tunnel syndrome, right upper limb: Secondary | ICD-10-CM | POA: Diagnosis not present

## 2017-09-20 ENCOUNTER — Other Ambulatory Visit: Payer: Self-pay | Admitting: Gynecology

## 2017-09-20 DIAGNOSIS — Z1231 Encounter for screening mammogram for malignant neoplasm of breast: Secondary | ICD-10-CM

## 2017-09-27 ENCOUNTER — Ambulatory Visit
Admission: RE | Admit: 2017-09-27 | Discharge: 2017-09-27 | Disposition: A | Discharge status: Home / Self Care | Payer: Commercial Managed Care - PPO | Source: Ambulatory Visit | Attending: Gynecology | Admitting: Gynecology

## 2017-09-27 DIAGNOSIS — Z1231 Encounter for screening mammogram for malignant neoplasm of breast: Secondary | ICD-10-CM | POA: Diagnosis not present

## 2017-10-02 ENCOUNTER — Other Ambulatory Visit: Payer: Self-pay | Admitting: Gynecology

## 2017-10-02 DIAGNOSIS — R928 Other abnormal and inconclusive findings on diagnostic imaging of breast: Secondary | ICD-10-CM

## 2017-10-07 ENCOUNTER — Ambulatory Visit
Admission: RE | Admit: 2017-10-07 | Discharge: 2017-10-07 | Disposition: A | Discharge status: Home / Self Care | Payer: Commercial Managed Care - PPO | Source: Ambulatory Visit | Attending: Gynecology | Admitting: Gynecology

## 2017-10-07 DIAGNOSIS — R928 Other abnormal and inconclusive findings on diagnostic imaging of breast: Secondary | ICD-10-CM

## 2017-10-07 DIAGNOSIS — N6310 Unspecified lump in the right breast, unspecified quadrant: Secondary | ICD-10-CM | POA: Diagnosis not present

## 2017-10-10 DIAGNOSIS — Z0000 Encounter for general adult medical examination without abnormal findings: Secondary | ICD-10-CM | POA: Diagnosis not present

## 2017-10-10 DIAGNOSIS — E782 Mixed hyperlipidemia: Secondary | ICD-10-CM | POA: Diagnosis not present

## 2017-10-10 DIAGNOSIS — Z131 Encounter for screening for diabetes mellitus: Secondary | ICD-10-CM | POA: Diagnosis not present

## 2017-10-10 DIAGNOSIS — R51 Headache: Secondary | ICD-10-CM | POA: Diagnosis not present

## 2017-10-24 DIAGNOSIS — R300 Dysuria: Secondary | ICD-10-CM | POA: Diagnosis not present

## 2017-10-24 DIAGNOSIS — N3000 Acute cystitis without hematuria: Secondary | ICD-10-CM | POA: Diagnosis not present

## 2017-10-24 DIAGNOSIS — R350 Frequency of micturition: Secondary | ICD-10-CM | POA: Diagnosis not present

## 2017-10-29 DIAGNOSIS — R3915 Urgency of urination: Secondary | ICD-10-CM | POA: Diagnosis not present

## 2017-10-29 DIAGNOSIS — N951 Menopausal and female climacteric states: Secondary | ICD-10-CM | POA: Diagnosis not present

## 2017-10-29 DIAGNOSIS — Z130 Encounter for screening for diseases of the blood and blood-forming organs and certain disorders involving the immune mechanism: Secondary | ICD-10-CM | POA: Diagnosis not present

## 2017-10-29 DIAGNOSIS — Z01419 Encounter for gynecological examination (general) (routine) without abnormal findings: Secondary | ICD-10-CM | POA: Diagnosis not present

## 2017-10-29 DIAGNOSIS — Z780 Asymptomatic menopausal state: Secondary | ICD-10-CM | POA: Diagnosis not present

## 2017-12-04 DIAGNOSIS — R05 Cough: Secondary | ICD-10-CM | POA: Diagnosis not present

## 2017-12-04 DIAGNOSIS — J029 Acute pharyngitis, unspecified: Secondary | ICD-10-CM | POA: Diagnosis not present

## 2017-12-17 DIAGNOSIS — J069 Acute upper respiratory infection, unspecified: Secondary | ICD-10-CM | POA: Diagnosis not present

## 2017-12-30 DIAGNOSIS — D2262 Melanocytic nevi of left upper limb, including shoulder: Secondary | ICD-10-CM | POA: Diagnosis not present

## 2017-12-30 DIAGNOSIS — L565 Disseminated superficial actinic porokeratosis (DSAP): Secondary | ICD-10-CM | POA: Diagnosis not present

## 2017-12-30 DIAGNOSIS — L570 Actinic keratosis: Secondary | ICD-10-CM | POA: Diagnosis not present

## 2018-01-22 DIAGNOSIS — J029 Acute pharyngitis, unspecified: Secondary | ICD-10-CM | POA: Diagnosis not present

## 2018-04-15 DIAGNOSIS — M25552 Pain in left hip: Secondary | ICD-10-CM | POA: Diagnosis not present

## 2018-04-15 DIAGNOSIS — M25551 Pain in right hip: Secondary | ICD-10-CM | POA: Diagnosis not present

## 2018-04-15 DIAGNOSIS — M7062 Trochanteric bursitis, left hip: Secondary | ICD-10-CM | POA: Diagnosis not present

## 2018-07-08 DIAGNOSIS — N3000 Acute cystitis without hematuria: Secondary | ICD-10-CM | POA: Diagnosis not present

## 2018-07-08 DIAGNOSIS — R350 Frequency of micturition: Secondary | ICD-10-CM | POA: Diagnosis not present

## 2018-07-21 DIAGNOSIS — D2262 Melanocytic nevi of left upper limb, including shoulder: Secondary | ICD-10-CM | POA: Diagnosis not present

## 2018-07-21 DIAGNOSIS — L821 Other seborrheic keratosis: Secondary | ICD-10-CM | POA: Diagnosis not present

## 2018-07-21 DIAGNOSIS — D1801 Hemangioma of skin and subcutaneous tissue: Secondary | ICD-10-CM | POA: Diagnosis not present

## 2018-07-21 DIAGNOSIS — L570 Actinic keratosis: Secondary | ICD-10-CM | POA: Diagnosis not present

## 2018-08-12 DIAGNOSIS — Z23 Encounter for immunization: Secondary | ICD-10-CM | POA: Diagnosis not present

## 2018-09-16 DIAGNOSIS — J069 Acute upper respiratory infection, unspecified: Secondary | ICD-10-CM | POA: Diagnosis not present

## 2018-10-23 DIAGNOSIS — Z0000 Encounter for general adult medical examination without abnormal findings: Secondary | ICD-10-CM | POA: Diagnosis not present

## 2018-10-23 DIAGNOSIS — I10 Essential (primary) hypertension: Secondary | ICD-10-CM | POA: Diagnosis not present

## 2018-10-23 DIAGNOSIS — E785 Hyperlipidemia, unspecified: Secondary | ICD-10-CM | POA: Diagnosis not present

## 2018-10-24 ENCOUNTER — Other Ambulatory Visit: Payer: Self-pay | Admitting: Gynecology

## 2018-10-24 DIAGNOSIS — Z1231 Encounter for screening mammogram for malignant neoplasm of breast: Secondary | ICD-10-CM

## 2018-10-30 DIAGNOSIS — Z79890 Hormone replacement therapy (postmenopausal): Secondary | ICD-10-CM | POA: Diagnosis not present

## 2018-10-30 DIAGNOSIS — Z780 Asymptomatic menopausal state: Secondary | ICD-10-CM | POA: Diagnosis not present

## 2018-10-30 DIAGNOSIS — Z130 Encounter for screening for diseases of the blood and blood-forming organs and certain disorders involving the immune mechanism: Secondary | ICD-10-CM | POA: Diagnosis not present

## 2018-10-30 DIAGNOSIS — Z01419 Encounter for gynecological examination (general) (routine) without abnormal findings: Secondary | ICD-10-CM | POA: Diagnosis not present

## 2018-11-25 ENCOUNTER — Ambulatory Visit
Admission: RE | Admit: 2018-11-25 | Discharge: 2018-11-25 | Disposition: A | Discharge status: Home / Self Care | Payer: Commercial Managed Care - PPO | Source: Ambulatory Visit | Attending: Gynecology | Admitting: Gynecology

## 2018-11-25 DIAGNOSIS — Z1231 Encounter for screening mammogram for malignant neoplasm of breast: Secondary | ICD-10-CM | POA: Diagnosis not present

## 2019-03-11 ENCOUNTER — Ambulatory Visit: Payer: Self-pay

## 2019-03-11 ENCOUNTER — Other Ambulatory Visit: Payer: Self-pay

## 2019-03-11 ENCOUNTER — Encounter: Payer: Self-pay | Admitting: Orthopaedic Surgery

## 2019-03-11 ENCOUNTER — Ambulatory Visit (INDEPENDENT_AMBULATORY_CARE_PROVIDER_SITE_OTHER): Payer: Commercial Managed Care - PPO | Admitting: Orthopaedic Surgery

## 2019-03-11 DIAGNOSIS — M545 Low back pain, unspecified: Secondary | ICD-10-CM

## 2019-03-11 MED ORDER — METHOCARBAMOL 500 MG PO TABS: 500.0000 mg | ORAL_TABLET | Freq: Every evening | ORAL | 0 refills | Status: AC | PRN

## 2019-03-11 MED ORDER — PREDNISONE 10 MG (21) PO TBPK: ORAL_TABLET | ORAL | 0 refills | Status: DC

## 2019-03-11 NOTE — Progress Notes (Signed)
Office Visit Note   Patient: [name]           Date of Birth: 26-Dec-1960           MRN: [id number] Visit Date: 03/11/2019              Requested by: Marda Stalker, PA-C [place], [place] [address] PCP: Marda Stalker, PA-C   Assessment & Plan: Visit Diagnoses:  1. Acute low back pain, unspecified back pain laterality, unspecified whether sciatica present     Plan: Impression is right sided sciatica and underlying right trochanteric bursitis.  We will start the patient on a Sterapred taper and muscle relaxer as needed.  I will also start her in formal physical therapy.  If she is not any better in the next 6 to 8 weeks she will call us and we will obtain an MRI of her lumbar spine.  Call with concerns or questions in the meantime.  Follow-Up Instructions: Return if symptoms worsen or fail to improve.   Orders:  Orders Placed This Encounter  Procedures   XR Lumbar Spine 2-3 Views   Meds ordered this encounter  Medications   predniSONE (STERAPRED UNI-PAK 21 TAB) 10 MG (21) TBPK tablet    Sig: Take as directed    Dispense:  21 tablet    Refill:  0   methocarbamol (ROBAXIN) 500 MG tablet    Sig: Take 1 tablet (500 mg total) by mouth at bedtime as needed for muscle spasms.    Dispense:  20 tablet    Refill:  0      Procedures: No procedures performed   Clinical Data: No additional findings.   Subjective: Chief Complaint  Patient presents with   Lower Back - Pain   Right Hip - Pain    HPI patient is a pleasant 58 year old female who presents our clinic today with right lower back and leg pain.  This has been ongoing for the past 4 weeks.  No specific injury, but she does note that she was doing a lot of yard work prior to onset of her symptoms.  The pain she has is to the right lower back radiating into her buttocks and down the back of her leg.  No anterior thigh or groin pain.  She does note a history of trochanteric bursitis which  has been injected in the past.  She describes this as an intermittent ache with no specific aggravators.  She does note weakness to the right leg as well.  She has occasional muscle spasms that do wake her from her sleep.  She has tried NSAIDs as well as ice and heat without relief of symptoms.  She denies any numbness, tingling or burning to the right lower extremity.  No bowel or bladder change and no saddle paresthesias.  No previous history of lumbar pathology.  Review of Systems as detailed in HPI.  All others reviewed and are negative.   Objective: Vital Signs: There were no vitals taken for this visit.  Physical Exam well-developed well-nourished female in no acute distress.  Alert and oriented x3.  Ortho Exam examination of her lumbar spine shows no pain with lumbar flexion or extension.  Positive straight leg raise.  Negative logroll.  She has moderate tenderness over the trochanteric bursa.  No focal weakness.  She is neurovascular intact distally.  Specialty Comments:  No specialty comments available.  Imaging: Xr Lumbar Spine 2-3 Views  Result Date: 03/11/2019 X-rays demonstrate diffuse  degenerative disc disease worse at L5-S1    PMFS History: There are no active problems to display for this patient.  History reviewed. No pertinent past medical history.  History reviewed. No pertinent family history.  History reviewed. No pertinent surgical history. Social History   Occupational History   Not on file  Tobacco Use   Smoking status: Never Smoker   Smokeless tobacco: Never Used  Substance and Sexual Activity   Alcohol use: Not on file   Drug use: Not on file   Sexual activity: Not on file

## 2019-03-21 ENCOUNTER — Telehealth: Payer: Self-pay | Admitting: Adult Health

## 2019-03-21 NOTE — Telephone Encounter (Signed)
Left voicemail regarding recent orthopedic appointment and need for further testing related to potential Covid 19 exposure.

## 2019-04-07 ENCOUNTER — Other Ambulatory Visit: Payer: Self-pay | Admitting: Physician Assistant

## 2019-07-14 ENCOUNTER — Ambulatory Visit (INDEPENDENT_AMBULATORY_CARE_PROVIDER_SITE_OTHER): Payer: Commercial Managed Care - PPO | Admitting: Orthopaedic Surgery

## 2019-07-14 ENCOUNTER — Other Ambulatory Visit: Payer: Self-pay

## 2019-07-14 ENCOUNTER — Encounter: Payer: Self-pay | Admitting: Orthopaedic Surgery

## 2019-07-14 DIAGNOSIS — M4807 Spinal stenosis, lumbosacral region: Secondary | ICD-10-CM

## 2019-07-14 DIAGNOSIS — M5441 Lumbago with sciatica, right side: Secondary | ICD-10-CM

## 2019-07-14 DIAGNOSIS — M7061 Trochanteric bursitis, right hip: Secondary | ICD-10-CM | POA: Diagnosis not present

## 2019-07-14 MED ORDER — METHYLPREDNISOLONE ACETATE 40 MG/ML IJ SUSP
40.0000 mg | INTRAMUSCULAR | Status: AC | PRN
  Administered 2019-07-14: 40 mg via INTRA_ARTICULAR

## 2019-07-14 MED ORDER — BUPIVACAINE HCL 0.5 % IJ SOLN
3.0000 mL | INTRAMUSCULAR | Status: AC | PRN
  Administered 2019-07-14: 3 mL via INTRA_ARTICULAR

## 2019-07-14 MED ORDER — DIAZEPAM 5 MG PO TABS: 5.0000 mg | ORAL_TABLET | Freq: Once | ORAL | 0 refills | Status: AC

## 2019-07-14 MED ORDER — LIDOCAINE HCL 1 % IJ SOLN
3.0000 mL | INTRAMUSCULAR | Status: AC | PRN
  Administered 2019-07-14: 3 mL

## 2019-07-14 NOTE — Progress Notes (Signed)
Office Visit Note   Patient: [name]           Date of Birth: Jul 12, 1961           MRN: [id number] Visit Date: 07/14/2019              Requested by: Marda Stalker, PA-C [place],  [place] [id number] PCP: Marda Stalker, PA-C   Assessment & Plan: Visit Diagnoses:  1. Acute right-sided low back pain with right-sided sciatica   2. Trochanteric bursitis, right hip     Plan: Impression is continued low back pain and lumbar radiculopathy as well as right hip trochanteric bursitis.  For her back she has failed conservative treatment therefore we will obtain an MRI to rule out structural abnormalities.  We will see her back after the MRI.  For her trochanteric bursitis she will continue with her stretches.  We also performed a cortisone injection today.  Follow-Up Instructions: Return in about 2 weeks (around 07/28/2019) for MRI review.   Orders:  No orders of the defined types were placed in this encounter.  Meds ordered this encounter  Medications  . diazepam (VALIUM) 5 MG tablet    Sig: Take 1-2 tablets (5-10 mg total) by mouth once for 1 dose.    Dispense:  2 tablet    Refill:  0      Procedures: Large Joint Inj: R greater trochanter on 07/14/2019 8:08 PM Indications: pain Details: 22 G needle  Arthrogram: No  Medications: 3 mL lidocaine 1 %; 3 mL bupivacaine 0.5 %; 40 mg methylPREDNISolone acetate 40 MG/ML Patient was prepped and draped in the usual sterile fashion.       Clinical Data: No additional findings.   Subjective: Chief Complaint  Patient presents with  . Right Leg - Pain    [name] is a 58 year old female comes back for follow-up of continued right-sided sciatic pain and right radicular symptoms as well as her lateral right hip pain.  Denies any injuries.  The pain is worse in her right hip at night when laying on her right side.  She denies any red flag symptoms or signs.  The prednisone and muscle relaxers helped only  partially.  She has been doing stretches on her own.   Review of Systems  Constitutional: Negative.   HENT: Negative.   Eyes: Negative.   Respiratory: Negative.   Cardiovascular: Negative.   Endocrine: Negative.   Musculoskeletal: Negative.   Neurological: Negative.   Hematological: Negative.   Psychiatric/Behavioral: Negative.   All other systems reviewed and are negative.    Objective: Vital Signs: There were no vitals taken for this visit.  Physical Exam Vitals signs and nursing note reviewed.  Constitutional:      Appearance: She is well-developed.  HENT:     Head: Normocephalic and atraumatic.  Neck:     Musculoskeletal: Neck supple.  Pulmonary:     Effort: Pulmonary effort is normal.  Abdominal:     Palpations: Abdomen is soft.  Skin:    General: Skin is warm.     Capillary Refill: Capillary refill takes less than 2 seconds.  Neurological:     Mental Status: She is alert and oriented to person, place, and time.  Psychiatric:        Behavior: Behavior normal.        Thought Content: Thought content normal.        Judgment: Judgment normal.     Ortho Exam Right hip exam  shows good range of motion without pain in the groin.  The lateral hip is tender to palpation moderately.  No sciatic tension signs.  Lumbar spine is slightly tender.  No pain in the right hip with single leg stance.  No pain with hip abduction. Specialty Comments:  No specialty comments available.  Imaging: No results found.   PMFS History: Patient Active Problem List   Diagnosis Date Noted  . Trochanteric bursitis, right hip 07/14/2019  . Acute right-sided low back pain with right-sided sciatica 07/14/2019   History reviewed. No pertinent past medical history.  History reviewed. No pertinent family history.  History reviewed. No pertinent surgical history. Social History   Occupational History  . Not on file  Tobacco Use  . Smoking status: Never Smoker  . Smokeless tobacco:  Never Used  Substance and Sexual Activity  . Alcohol use: Not on file  . Drug use: Not on file  . Sexual activity: Not on file

## 2019-07-28 ENCOUNTER — Ambulatory Visit: Payer: Commercial Managed Care - PPO | Admitting: Orthopedic Surgery

## 2019-08-04 ENCOUNTER — Ambulatory Visit
Admission: RE | Admit: 2019-08-04 | Discharge: 2019-08-04 | Disposition: A | Discharge status: Home / Self Care | Payer: Commercial Managed Care - PPO | Source: Ambulatory Visit | Attending: Orthopaedic Surgery | Admitting: Orthopaedic Surgery

## 2019-08-04 ENCOUNTER — Other Ambulatory Visit: Payer: Self-pay

## 2019-08-04 DIAGNOSIS — M4807 Spinal stenosis, lumbosacral region: Secondary | ICD-10-CM

## 2019-08-06 ENCOUNTER — Ambulatory Visit (INDEPENDENT_AMBULATORY_CARE_PROVIDER_SITE_OTHER): Payer: Commercial Managed Care - PPO | Admitting: Orthopaedic Surgery

## 2019-08-06 ENCOUNTER — Encounter: Payer: Self-pay | Admitting: Orthopaedic Surgery

## 2019-08-06 ENCOUNTER — Other Ambulatory Visit: Payer: Self-pay

## 2019-08-06 VITALS — Ht 69.0 in | Wt 225.0 lb

## 2019-08-06 DIAGNOSIS — M5416 Radiculopathy, lumbar region: Secondary | ICD-10-CM | POA: Diagnosis not present

## 2019-08-06 MED ORDER — PREDNISONE 10 MG (21) PO TBPK: ORAL_TABLET | ORAL | 0 refills | Status: AC

## 2019-08-06 NOTE — Progress Notes (Signed)
     Patient: [name]           Date of Birth: 1961-08-02           MRN: [id number] Visit Date: 08/06/2019 PCP: Marda Stalker, PA-C   Assessment & Plan:  Chief Complaint:  Chief Complaint  Patient presents with  . Lower Back - Follow-up    MRI results   Visit Diagnoses:  1. Radiculopathy, lumbar region     Plan: Patient is a 58 year old female presents to clinic today discuss MRI results of her lumbar spine.  She has had continued right lower extremity radiculopathy for the past several months.  She has also been dealing with right-sided trochanteric bursitis.  She had an injection to the trochanteric bursa with minimal relief of symptoms.  She has finished her stretches as well as her home exercise program and still complains of pain.  MRI results of the lumbar spine show small disc bulges throughout the entire lumbar spine, moderate facet arthritis at L4-5 and L5-S1 as well as a small central annular tear at L4-5.  At this point, the patient has failed oral steroids, muscle relaxers as well as a home exercise physical therapy program.  We will refer her to Dr. Ernestina Patches for an ESI.  She will follow-up with [place] as needed.  Follow-Up Instructions: Return for f/u with Dr. Ernestina Patches for [facility].   Orders:  No orders of the defined types were placed in this encounter.  No orders of the defined types were placed in this encounter.   Imaging: No new imaging  PMFS History: Patient Active Problem List   Diagnosis Date Noted  . Trochanteric bursitis, right hip 07/14/2019  . Acute right-sided low back pain with right-sided sciatica 07/14/2019   History reviewed. No pertinent past medical history.  History reviewed. No pertinent family history.  History reviewed. No pertinent surgical history. Social History   Occupational History  . Not on file  Tobacco Use  . Smoking status: Never Smoker  . Smokeless tobacco: Never Used  Substance and Sexual Activity  . Alcohol use: Not on  file  . Drug use: Not on file  . Sexual activity: Not on file

## 2019-08-31 ENCOUNTER — Ambulatory Visit: Payer: Self-pay

## 2019-08-31 ENCOUNTER — Ambulatory Visit (INDEPENDENT_AMBULATORY_CARE_PROVIDER_SITE_OTHER): Payer: Commercial Managed Care - PPO | Admitting: Physical Medicine and Rehabilitation

## 2019-08-31 ENCOUNTER — Other Ambulatory Visit: Payer: Self-pay

## 2019-08-31 ENCOUNTER — Encounter: Payer: Self-pay | Admitting: Physical Medicine and Rehabilitation

## 2019-08-31 VITALS — BP 139/85 | HR 70

## 2019-08-31 DIAGNOSIS — G8929 Other chronic pain: Secondary | ICD-10-CM

## 2019-08-31 DIAGNOSIS — M545 Low back pain, unspecified: Secondary | ICD-10-CM

## 2019-08-31 DIAGNOSIS — M5416 Radiculopathy, lumbar region: Secondary | ICD-10-CM

## 2019-08-31 DIAGNOSIS — M47816 Spondylosis without myelopathy or radiculopathy, lumbar region: Secondary | ICD-10-CM | POA: Diagnosis not present

## 2019-08-31 MED ORDER — METHYLPREDNISOLONE ACETATE 80 MG/ML IJ SUSP
80.0000 mg | Freq: Once | INTRAMUSCULAR | Status: AC
  Administered 2019-08-31: 80 mg

## 2019-08-31 NOTE — Progress Notes (Signed)
.  Numeric Pain Rating Scale and Functional Assessment Average Pain 9   In the last MONTH (on 0-10 scale) has pain interfered with the following?  1. General activity like being  able to carry out your everyday physical activities such as walking, climbing stairs, carrying groceries, or moving a chair?  Rating(8)   +Driver, -BT, -Dye Allergies.  

## 2019-09-21 ENCOUNTER — Other Ambulatory Visit: Payer: Self-pay

## 2019-09-21 ENCOUNTER — Encounter: Payer: Self-pay | Admitting: Physical Medicine and Rehabilitation

## 2019-09-21 ENCOUNTER — Ambulatory Visit: Payer: Commercial Managed Care - PPO | Admitting: Physical Medicine and Rehabilitation

## 2019-09-21 ENCOUNTER — Ambulatory Visit: Payer: Self-pay

## 2019-09-21 VITALS — BP 133/81 | HR 79

## 2019-09-21 DIAGNOSIS — M47816 Spondylosis without myelopathy or radiculopathy, lumbar region: Secondary | ICD-10-CM

## 2019-09-21 MED ORDER — METHYLPREDNISOLONE ACETATE 80 MG/ML IJ SUSP
80.0000 mg | Freq: Once | INTRAMUSCULAR | Status: AC
  Administered 2019-09-21: 80 mg

## 2019-09-21 NOTE — Progress Notes (Signed)
  Numeric Pain Rating Scale and Functional Assessment Average Pain 5   In the last MONTH (on 0-10 scale) has pain interfered with the following?  1. General activity like being  able to carry out your everyday physical activities such as walking, climbing stairs, carrying groceries, or moving a chair?  Rating(8)   +Driver, -BT, -Dye Allergies.  

## 2019-09-22 ENCOUNTER — Encounter: Payer: Self-pay | Admitting: Physical Medicine and Rehabilitation

## 2019-09-22 NOTE — Procedures (Signed)
Lumbar Diagnostic Facet Joint Nerve Block with Fluoroscopic Guidance   Patient: [name]      Date of Birth: August 09, 1961 MRN: [id number] PCP: Marda Stalker, PA-C      Visit Date: 09/21/2019   Universal Protocol:    Date/Time: 12/01/206:26 AM  Consent Given By: the patient  Position: PRONE  Additional Comments: Vital signs were monitored before and after the procedure. Patient was prepped and draped in the usual sterile fashion. The correct patient, procedure, and site was verified.   Injection Procedure Details:  Procedure Site One Meds Administered:  Meds ordered this encounter  Medications  . methylPREDNISolone acetate (DEPO-MEDROL) injection 80 mg     Laterality: Bilateral  Location/Site:  L4-L5  Needle size: 22 ga.  Needle type:spinal  Needle Placement: Oblique pedical  Findings:   -Comments: There was excellent flow of contrast along the articular pillars without intravascular flow.  Procedure Details: The fluoroscope beam is vertically oriented in AP and then obliqued 15 to 20 degrees to the ipsilateral side of the desired nerve to achieve the "Scotty dog" appearance.  The skin over the target area of the junction of the superior articulating process and the transverse process (sacral ala if blocking the L5 dorsal rami) was locally anesthetized with a 1 ml volume of 1% Lidocaine without Epinephrine.  The spinal needle was inserted and advanced in a trajectory view down to the target.   After contact with periosteum and negative aspirate for blood and CSF, correct placement without intravascular or epidural spread was confirmed by injecting 0.5 ml. of Isovue-250.  A spot radiograph was obtained of this image.    Next, a 0.5 ml. volume of the injectate described above was injected. The needle was then redirected to the other facet joint nerves mentioned above if needed.  Prior to the procedure, the patient was given a Pain Diary which was completed for  baseline measurements.  After the procedure, the patient rated their pain every 30 minutes and will continue rating at this frequency for a total of 5 hours.  The patient has been asked to complete the Diary and return to [place] by mail, fax or hand delivered as soon as possible.   Additional Comments:  The patient tolerated the procedure well Dressing: 2 x 2 sterile gauze and Band-Aid    Post-procedure details: Patient was observed during the procedure. Post-procedure instructions were reviewed.  Patient left the clinic in stable condition.

## 2019-09-22 NOTE — Procedures (Signed)
Lumbar Epidural Steroid Injection - Interlaminar Approach with Fluoroscopic Guidance  Patient: [name]      Date of Birth: 02-22-1961 MRN: [id number] PCP: Marda Stalker, PA-C      Visit Date: 08/31/2019   Universal Protocol:     Consent Given By: the patient  Position: PRONE  Additional Comments: Vital signs were monitored before and after the procedure. Patient was prepped and draped in the usual sterile fashion. The correct patient, procedure, and site was verified.   Injection Procedure Details:  Procedure Site One Meds Administered:  Meds ordered this encounter  Medications  . methylPREDNISolone acetate (DEPO-MEDROL) injection 80 mg     Laterality: Right  Location/Site:  L4-L5  Needle size: 20 G  Needle type: Tuohy  Needle Placement: Paramedian epidural  Findings:   -Comments: Excellent flow of contrast into the epidural space.  Procedure Details: Using a paramedian approach from the side mentioned above, the region overlying the inferior lamina was localized under fluoroscopic visualization and the soft tissues overlying this structure were infiltrated with 4 ml. of 1% Lidocaine without Epinephrine. The Tuohy needle was inserted into the epidural space using a paramedian approach.   The epidural space was localized using loss of resistance along with lateral and bi-planar fluoroscopic views.  After negative aspirate for air, blood, and CSF, a 2 ml. volume of Isovue-250 was injected into the epidural space and the flow of contrast was observed. Radiographs were obtained for documentation purposes.    The injectate was administered into the level noted above.   Additional Comments:  The patient tolerated the procedure well Dressing: 2 x 2 sterile gauze and Band-Aid    Post-procedure details: Patient was observed during the procedure. Post-procedure instructions were reviewed.  Patient left the clinic in stable condition.

## 2019-09-22 NOTE — Progress Notes (Signed)
[name] - 58 y.o. female MRN [id number]  Date of birth: September 03, 1961  Office Visit Note: Visit Date: 09/21/2019 PCP: Marda Stalker, PA-C Referred by: Marda Stalker, PA-C  Subjective: Chief Complaint  Patient presents with  . Lower Back - Pain   HPI:  [name] is a 58 y.o. female who comes in today For planned facet joint blocks.  She is status post epidural injection which has totally resolved the pain that she was having in the leg.  She has mainly facet arthropathy with some lateral recess narrowing no high-grade stenosis.  Back pain on initial evaluation was more consistent with facet mediated low back pain and this is consistent today.  She reports mostly low back pain bilaterally worse with standing and exam today is worse with facet loading and extension.  We will complete diagnostic facet blocks today looking in the future at possibly radiofrequency ablation for more definitive care.  Please see our prior notes for the details justification.  She is followed in the office with orthopedic standpoint by Dr. Eduard Roux and his notes can be reviewed as well.  ROS Otherwise per HPI.  Assessment & Plan: Visit Diagnoses:  1. Spondylosis without myelopathy or radiculopathy, lumbar region     Plan: No additional findings.   Meds & Orders:  Meds ordered this encounter  Medications  . methylPREDNISolone acetate (DEPO-MEDROL) injection 80 mg    Orders Placed This Encounter  Procedures  . Facet Injection  . XR C-ARM NO REPORT    Follow-up: Return if symptoms worsen or fail to improve.   Procedures: No procedures performed  Lumbar Diagnostic Facet Joint Nerve Block with Fluoroscopic Guidance   Patient: [name]      Date of Birth: Jun 02, 1961 MRN: [id number] PCP: Marda Stalker, PA-C      Visit Date: 09/21/2019   Universal Protocol:    Date/Time: 12/01/206:26 AM  Consent Given By: the patient  Position: PRONE  Additional Comments: Vital signs  were monitored before and after the procedure. Patient was prepped and draped in the usual sterile fashion. The correct patient, procedure, and site was verified.   Injection Procedure Details:  Procedure Site One Meds Administered:  Meds ordered this encounter  Medications  . methylPREDNISolone acetate (DEPO-MEDROL) injection 80 mg     Laterality: Bilateral  Location/Site:  L4-L5  Needle size: 22 ga.  Needle type:spinal  Needle Placement: Oblique pedical  Findings:   -Comments: There was excellent flow of contrast along the articular pillars without intravascular flow.  Procedure Details: The fluoroscope beam is vertically oriented in AP and then obliqued 15 to 20 degrees to the ipsilateral side of the desired nerve to achieve the "Scotty dog" appearance.  The skin over the target area of the junction of the superior articulating process and the transverse process (sacral ala if blocking the L5 dorsal rami) was locally anesthetized with a 1 ml volume of 1% Lidocaine without Epinephrine.  The spinal needle was inserted and advanced in a trajectory view down to the target.   After contact with periosteum and negative aspirate for blood and CSF, correct placement without intravascular or epidural spread was confirmed by injecting 0.5 ml. of Isovue-250.  A spot radiograph was obtained of this image.    Next, a 0.5 ml. volume of the injectate described above was injected. The needle was then redirected to the other facet joint nerves mentioned above if needed.  Prior to the procedure, the patient was given a Pain  Diary which was completed for baseline measurements.  After the procedure, the patient rated their pain every 30 minutes and will continue rating at this frequency for a total of 5 hours.  The patient has been asked to complete the Diary and return to [place] by mail, fax or hand delivered as soon as possible.   Additional Comments:  The patient tolerated the procedure well  Dressing: 2 x 2 sterile gauze and Band-Aid    Post-procedure details: Patient was observed during the procedure. Post-procedure instructions were reviewed.  Patient left the clinic in stable condition.   Clinical History: MRI LUMBAR SPINE WITHOUT CONTRAST  TECHNIQUE: Multiplanar, multisequence MR imaging of the lumbar spine was performed. No intravenous contrast was administered.  COMPARISON:  Lumbar radiographs dated 03/11/2019  FINDINGS: Segmentation:  Standard.  Alignment:  Physiologic.  Vertebrae:  No fracture, evidence of discitis, or bone lesion.  Conus medullaris and cauda equina: Conus extends to the L1-2 level. Conus and cauda equina appear normal.  Paraspinal and other soft tissues: Negative.  Disc levels:  T12-L1: Normal.  L1-2: Small focal disc protrusion to the left of midline slightly indenting the thecal sac but without focal neural impingement.  L2-3: Tiny disc bulge into the left neural foramen without neural impingement.  L3-4: Small broad-based disc bulge symmetrically compressing the ventral aspect of the thecal sac slightly without neural impingement.  L4-5: Small broad-based disc bulge with a central annular tear minimally indenting the ventral aspect of the thecal sac without focal neural impingement. Moderate bilateral facet arthritis with bilateral joint effusions. No foraminal stenosis.  L5-S1: Normal disc. Moderate left and minimal right facet arthritis.  IMPRESSION: Moderate  facet arthritis at L4-5 and L5-S1.  Small focal disc protrusion at L1-2 to the left of midline without neural impingement.  Small central annular tear at L4-5 without neural impingement.   Electronically Signed   By: Lorriane Shire M.D.   On: 08/05/2019 13:25     Objective:  VS:  HT:    WT:   BMI:     BP:133/81  HR:79bpm  TEMP: ( )  RESP:  Physical Exam  Ortho Exam Imaging: Xr C-arm No Report  Result Date: 09/21/2019  Please see Notes tab for imaging impression.

## 2019-09-22 NOTE — Progress Notes (Signed)
[name] - 58 y.o. female MRN [id number]  Date of birth: 04-24-1961  Office Visit Note: Visit Date: 08/31/2019 PCP: Marda Stalker, PA-C Referred by: Marda Stalker, PA-C  Subjective: Chief Complaint  Patient presents with  . Lower Back - Pain  . Right Leg - Pain   HPI: [name] is a 58 y.o. female who comes in today At the request of Dr. Eduard Roux for evaluation management of low back pain with right hip pain.  Patient reports pain in the lower back mostly right side but bilateral refers into the right buttocks and hip.  She reports this started in April 2020.  She reports doing a lot of yard work before the onset of pain but no specific injury.  She tried heat ice and rest as well as anti-inflammatories without much relief.  She is up seeing Dr. Eduard Roux in May of that year and had steroid prednisone taper which helped while she was taking it.  Ultimately MRI was performed and this is reviewed with her today with spine models and the imaging.  He felt like she had some level of trochanteric bursitis.  Injection was not as beneficial as late hoped.  She still gets some pain laying on the right side with the hip.  Rates her pain as a 9 out of 10 and it does limit her ability to do things.  She has had no focal weakness or bowel bladder changes or any red flag complaints.  No prior surgical history.  No history of lumbar injections.  Has small annular tear on the MRI with pretty good facet arthritis at L4-5 somewhat less at L5-S1.  No high-grade stenosis at all.  Review of Systems  Constitutional: Negative for chills, fever, malaise/fatigue and weight loss.  HENT: Negative for hearing loss and sinus pain.   Eyes: Negative for blurred vision, double vision and photophobia.  Respiratory: Negative for cough and shortness of breath.   Cardiovascular: Negative for chest pain, palpitations and leg swelling.  Gastrointestinal: Negative for abdominal pain, nausea and vomiting.   Genitourinary: Negative for flank pain.  Musculoskeletal: Positive for back pain and joint pain. Negative for myalgias.       Right hip pain  Skin: Negative for itching and rash.  Neurological: Negative for tingling, tremors, focal weakness and weakness.  Endo/Heme/Allergies: Negative.   Psychiatric/Behavioral: Negative for depression.  All other systems reviewed and are negative.  Otherwise per HPI.  Assessment & Plan: Visit Diagnoses:  1. Lumbar radiculopathy   2. Spondylosis without myelopathy or radiculopathy, lumbar region   3. Chronic bilateral low back pain without sciatica     Plan: Findings:  Chronic low back and right hip pain since April of this year failed conservative care including anti-inflammatory medications as well as rest and heat and ice and home exercise.  Trochanteric injection not beneficial.  MRI showing facet arthritis as well as annular tear of the disc no focal herniation or stenosis or nerve compression.  I think the best approach is diagnostic epidural injection to see if the right hip pain is related to the nerve root itself.  Without any compression here though if she gets relief with that but is still having back pain I would look at diagnostic medial branch blocks and regrouping with physical therapy and ultimately looking at radiofrequency ablation for more longer term relief.  She may do well just with the epidural with more acute onset of pain in April.  We will see  how she does today with the injection and will follow up with her appropriately and she can continue to see Dr. Eduard Roux if needed.    Meds & Orders:  Meds ordered this encounter  Medications  . methylPREDNISolone acetate (DEPO-MEDROL) injection 80 mg    Orders Placed This Encounter  Procedures  . XR C-ARM NO REPORT  . Epidural Steroid injection    Follow-up: Return in about 2 weeks (around 09/14/2019) for Potential facet joint block at L4-5.   Procedures: No procedures performed   Lumbar Epidural Steroid Injection - Interlaminar Approach with Fluoroscopic Guidance  Patient: [name]      Date of Birth: 08-Nov-1960 MRN: [id number] PCP: Marda Stalker, PA-C      Visit Date: 08/31/2019   Universal Protocol:     Consent Given By: the patient  Position: PRONE  Additional Comments: Vital signs were monitored before and after the procedure. Patient was prepped and draped in the usual sterile fashion. The correct patient, procedure, and site was verified.   Injection Procedure Details:  Procedure Site One Meds Administered:  Meds ordered this encounter  Medications  . methylPREDNISolone acetate (DEPO-MEDROL) injection 80 mg     Laterality: Right  Location/Site:  L4-L5  Needle size: 20 G  Needle type: Tuohy  Needle Placement: Paramedian epidural  Findings:   -Comments: Excellent flow of contrast into the epidural space.  Procedure Details: Using a paramedian approach from the side mentioned above, the region overlying the inferior lamina was localized under fluoroscopic visualization and the soft tissues overlying this structure were infiltrated with 4 ml. of 1% Lidocaine without Epinephrine. The Tuohy needle was inserted into the epidural space using a paramedian approach.   The epidural space was localized using loss of resistance along with lateral and bi-planar fluoroscopic views.  After negative aspirate for air, blood, and CSF, a 2 ml. volume of Isovue-250 was injected into the epidural space and the flow of contrast was observed. Radiographs were obtained for documentation purposes.    The injectate was administered into the level noted above.   Additional Comments:  The patient tolerated the procedure well Dressing: 2 x 2 sterile gauze and Band-Aid    Post-procedure details: Patient was observed during the procedure. Post-procedure instructions were reviewed.  Patient left the clinic in stable condition.   Clinical History:  MRI LUMBAR SPINE WITHOUT CONTRAST  TECHNIQUE: Multiplanar, multisequence MR imaging of the lumbar spine was performed. No intravenous contrast was administered.  COMPARISON:  Lumbar radiographs dated 03/11/2019  FINDINGS: Segmentation:  Standard.  Alignment:  Physiologic.  Vertebrae:  No fracture, evidence of discitis, or bone lesion.  Conus medullaris and cauda equina: Conus extends to the L1-2 level. Conus and cauda equina appear normal.  Paraspinal and other soft tissues: Negative.  Disc levels:  T12-L1: Normal.  L1-2: Small focal disc protrusion to the left of midline slightly indenting the thecal sac but without focal neural impingement.  L2-3: Tiny disc bulge into the left neural foramen without neural impingement.  L3-4: Small broad-based disc bulge symmetrically compressing the ventral aspect of the thecal sac slightly without neural impingement.  L4-5: Small broad-based disc bulge with a central annular tear minimally indenting the ventral aspect of the thecal sac without focal neural impingement. Moderate bilateral facet arthritis with bilateral joint effusions. No foraminal stenosis.  L5-S1: Normal disc. Moderate left and minimal right facet arthritis.  IMPRESSION: Moderate  facet arthritis at L4-5 and L5-S1.  Small focal disc protrusion at L1-2 to  the left of midline without neural impingement.  Small central annular tear at L4-5 without neural impingement.   Electronically Signed   By: Lorriane Shire M.D.   On: 08/05/2019 13:25   She reports that she has never smoked. She has never used smokeless tobacco. No results for input(s): HGBA1C, LABURIC in the last 8760 hours.  Objective:  VS:  HT:    WT:   BMI:     BP:139/85  HR:70bpm  TEMP: ( )  RESP:  Physical Exam Vitals signs and nursing note reviewed.  Constitutional:      General: She is not in acute distress.    Appearance: Normal appearance. She is well-developed.  She is not ill-appearing.  HENT:     Head: Normocephalic and atraumatic.  Eyes:     Conjunctiva/sclera: Conjunctivae normal.     Pupils: Pupils are equal, round, and reactive to light.  Cardiovascular:     Rate and Rhythm: Normal rate.     Pulses: Normal pulses.  Pulmonary:     Effort: Pulmonary effort is normal.  Musculoskeletal:     Right lower leg: No edema.     Left lower leg: No edema.     Comments: Some difficulty going sit to stand in full extension with pain with facet loading.  Pain is concordant with her lower back but not her hip.  No hip pain with rotation although some pain with external rotation on the right.  She is tender over the right greater trochanter but not exquisitely.  She has good distal strength.  She does have an equivocally positive slump test on the right but it does not really cause a great deal of pain she just is tight.  No clonus.  Skin:    General: Skin is warm and dry.     Findings: No erythema or rash.  Neurological:     General: No focal deficit present.     Mental Status: She is alert and oriented to person, place, and time.     Sensory: No sensory deficit.     Motor: No abnormal muscle tone.     Coordination: Coordination normal.     Gait: Gait normal.  Psychiatric:        Mood and Affect: Mood normal.        Behavior: Behavior normal.     Ortho Exam Imaging: Xr C-arm No Report  Result Date: 09/21/2019 Please see Notes tab for imaging impression.   Past Medical/Family/Surgical/Social History: Medications & Allergies reviewed per EMR, new medications updated. Patient Active Problem List   Diagnosis Date Noted  . Trochanteric bursitis, right hip 07/14/2019  . Acute right-sided low back pain with right-sided sciatica 07/14/2019   History reviewed. No pertinent past medical history. History reviewed. No pertinent family history. History reviewed. No pertinent surgical history. Social History   Occupational History  . Not on file   Tobacco Use  . Smoking status: Never Smoker  . Smokeless tobacco: Never Used  Substance and Sexual Activity  . Alcohol use: Not on file  . Drug use: Not on file  . Sexual activity: Not on file

## 2019-12-17 ENCOUNTER — Other Ambulatory Visit: Payer: Self-pay | Admitting: Gynecology

## 2019-12-17 DIAGNOSIS — Z1231 Encounter for screening mammogram for malignant neoplasm of breast: Secondary | ICD-10-CM

## 2020-01-19 ENCOUNTER — Ambulatory Visit: Payer: Commercial Managed Care - PPO

## 2020-03-18 ENCOUNTER — Telehealth: Payer: Self-pay | Admitting: Physical Medicine and Rehabilitation

## 2020-03-18 NOTE — Telephone Encounter (Signed)
Pt called to set an appt and she would like to know wether Dr. Ernestina Patches would also treat her for a knee issue?  [phone]

## 2020-03-22 NOTE — Telephone Encounter (Signed)
Called pt and she states she is mostly having right knee pain, scheduled her to see Dr. Ninfa Linden 04/11/20

## 2020-03-28 ENCOUNTER — Other Ambulatory Visit: Payer: Self-pay

## 2020-03-28 ENCOUNTER — Ambulatory Visit
Admission: RE | Admit: 2020-03-28 | Discharge: 2020-03-28 | Disposition: A | Discharge status: Home / Self Care | Payer: Commercial Managed Care - PPO | Source: Ambulatory Visit | Attending: Gynecology | Admitting: Gynecology

## 2020-03-28 ENCOUNTER — Ambulatory Visit: Payer: Commercial Managed Care - PPO

## 2020-03-28 DIAGNOSIS — Z1231 Encounter for screening mammogram for malignant neoplasm of breast: Secondary | ICD-10-CM

## 2020-04-11 ENCOUNTER — Other Ambulatory Visit: Payer: Self-pay

## 2020-04-11 ENCOUNTER — Ambulatory Visit (INDEPENDENT_AMBULATORY_CARE_PROVIDER_SITE_OTHER): Payer: Commercial Managed Care - PPO | Admitting: Orthopaedic Surgery

## 2020-04-11 ENCOUNTER — Ambulatory Visit: Payer: Self-pay

## 2020-04-11 DIAGNOSIS — M25561 Pain in right knee: Secondary | ICD-10-CM | POA: Diagnosis not present

## 2020-04-11 DIAGNOSIS — M25551 Pain in right hip: Secondary | ICD-10-CM

## 2020-04-11 DIAGNOSIS — G8929 Other chronic pain: Secondary | ICD-10-CM

## 2020-04-11 MED ORDER — LIDOCAINE HCL 1 % IJ SOLN
3.0000 mL | INTRAMUSCULAR | Status: AC | PRN
  Administered 2020-04-11: 3 mL

## 2020-04-11 MED ORDER — METHYLPREDNISOLONE ACETATE 40 MG/ML IJ SUSP
40.0000 mg | INTRAMUSCULAR | Status: AC | PRN
  Administered 2020-04-11: 40 mg via INTRA_ARTICULAR

## 2020-04-11 NOTE — Progress Notes (Signed)
Office Visit Note   Patient: [name]           Date of Birth: 30-Oct-1960           MRN: [id number] Visit Date: 04/11/2020              Requested by: Marda Stalker, PA-C [place],   [id number] PCP: Marda Stalker, PA-C   Assessment & Plan: Visit Diagnoses:  1. Chronic pain of right knee   2. Pain in right hip     Plan: I did recommend a steroid injection in her right hip trochanteric area and her right knee which she also requested.  She tolerated these well.  She understands the risks and benefits of steroid injections.  I would like to set her up for outpatient physical therapy on her right hip trochanteric area and the right IT band as well as her left hip.  We will see her back in about 6 weeks to consider injections in the left hip and left knee if needed.  All questions and concerns were answered and addressed.  Follow-Up Instructions: Return in about 6 weeks (around 05/23/2020).   Orders:  Orders Placed This Encounter  Procedures  . Large Joint Inj: R greater trochanter  . Large Joint Inj: R knee  . XR Knee 1-2 Views Right  . XR HIP UNILAT W OR W/O PELVIS 1V RIGHT   No orders of the defined types were placed in this encounter.     Procedures: Large Joint Inj: R greater trochanter on 04/11/2020 4:22 PM Indications: pain and diagnostic evaluation Details: 22 G 1.5 in needle, lateral approach  Arthrogram: No  Medications: 3 mL lidocaine 1 %; 40 mg methylPREDNISolone acetate 40 MG/ML Outcome: tolerated well, no immediate complications Procedure, treatment alternatives, risks and benefits explained, specific risks discussed. Consent was given by the patient. Immediately prior to procedure a time out was called to verify the correct patient, procedure, equipment, support staff and site/side marked as required. Patient was prepped and draped in the usual sterile fashion.   Large Joint Inj: R knee on 04/11/2020 4:22 PM Indications:  diagnostic evaluation and pain Details: 22 G 1.5 in needle, superolateral approach  Arthrogram: No  Medications: 3 mL lidocaine 1 %; 40 mg methylPREDNISolone acetate 40 MG/ML Outcome: tolerated well, no immediate complications Procedure, treatment alternatives, risks and benefits explained, specific risks discussed. Consent was given by the patient. Immediately prior to procedure a time out was called to verify the correct patient, procedure, equipment, support staff and site/side marked as required. Patient was prepped and draped in the usual sterile fashion.       Clinical Data: No additional findings.   Subjective: Chief Complaint  Patient presents with  . Right Knee - Pain  . Right Hip - Pain  The patient comes in today for evaluation treatment of right hip and right knee pain but she also has a left side but is really mild the left side and more severe on the right side.  I have seen her before and did place a steroid injection in her right knee in April 2018 and that did well for a while.  She has been dealing with pain for many months now.  Unfortunately her son passed away from cancer and she was with him quite a bit in the hospital and pacing quite a bit.  With time this exacerbated pain around the right hip and the right knee.  It is been only  mild on the left side but more severe on the right side.  She does get sharp pains at night and she is not resting well.  She has had no other acute change in her medical status.  She has gained some weight as well.  She denies any groin pain and denies any instability of her right knee.  HPI  Review of Systems She currently denies any headache, chest pain, shortness of breath, fever, chills, nausea, vomiting  Objective: Vital Signs: There were no vitals taken for this visit.  Physical Exam She is alert and orient x3 and in no acute distress Ortho Exam Examination of her right hip shows fluid and full range of motion in the right  hip.  There is pain with palpation significantly around the right hip and the IT band.  Her right knee shows no effusion but her knee on both sides hyperextend.  Both knees are ligamentously stable. Specialty Comments:  No specialty comments available.  Imaging: XR HIP UNILAT W OR W/O PELVIS 1V RIGHT  Result Date: 04/11/2020 An AP pelvis and lateral the right hip shows no acute findings.  The hip joint space is well-maintained.  XR Knee 1-2 Views Right  Result Date: 04/11/2020 2 views of the right knee show no acute findings.  The joint spaces well-maintained.    PMFS History: Patient Active Problem List   Diagnosis Date Noted  . Trochanteric bursitis, right hip 07/14/2019  . Acute right-sided low back pain with right-sided sciatica 07/14/2019   No past medical history on file.  No family history on file.  No past surgical history on file. Social History   Occupational History  . Not on file  Tobacco Use  . Smoking status: Never Smoker  . Smokeless tobacco: Never Used  Substance and Sexual Activity  . Alcohol use: Not on file  . Drug use: Not on file  . Sexual activity: Not on file

## 2020-05-23 ENCOUNTER — Ambulatory Visit: Payer: Commercial Managed Care - PPO | Admitting: Orthopaedic Surgery

## 2020-06-20 ENCOUNTER — Ambulatory Visit: Payer: Commercial Managed Care - PPO | Admitting: Orthopaedic Surgery

## 2021-07-06 ENCOUNTER — Other Ambulatory Visit: Payer: Self-pay | Admitting: Nurse Practitioner

## 2021-07-06 DIAGNOSIS — Z1231 Encounter for screening mammogram for malignant neoplasm of breast: Secondary | ICD-10-CM

## 2021-08-08 ENCOUNTER — Other Ambulatory Visit: Payer: Self-pay

## 2021-08-08 ENCOUNTER — Ambulatory Visit
Admission: RE | Admit: 2021-08-08 | Discharge: 2021-08-08 | Disposition: A | Discharge status: Home / Self Care | Payer: Commercial Managed Care - PPO | Source: Ambulatory Visit | Attending: Nurse Practitioner | Admitting: Nurse Practitioner

## 2021-08-08 DIAGNOSIS — Z1231 Encounter for screening mammogram for malignant neoplasm of breast: Secondary | ICD-10-CM

## 2021-12-11 DIAGNOSIS — E785 Hyperlipidemia, unspecified: Secondary | ICD-10-CM | POA: Diagnosis not present

## 2021-12-11 DIAGNOSIS — I10 Essential (primary) hypertension: Secondary | ICD-10-CM | POA: Diagnosis not present

## 2021-12-11 DIAGNOSIS — Z0000 Encounter for general adult medical examination without abnormal findings: Secondary | ICD-10-CM | POA: Diagnosis not present

## 2022-03-07 DIAGNOSIS — A6920 Lyme disease, unspecified: Secondary | ICD-10-CM | POA: Diagnosis not present

## 2022-03-14 DIAGNOSIS — S90862D Insect bite (nonvenomous), left foot, subsequent encounter: Secondary | ICD-10-CM | POA: Diagnosis not present

## 2022-03-14 DIAGNOSIS — W57XXXD Bitten or stung by nonvenomous insect and other nonvenomous arthropods, subsequent encounter: Secondary | ICD-10-CM | POA: Diagnosis not present

## 2022-05-01 DIAGNOSIS — H43391 Other vitreous opacities, right eye: Secondary | ICD-10-CM | POA: Diagnosis not present

## 2022-07-25 ENCOUNTER — Other Ambulatory Visit: Payer: Self-pay | Admitting: Gynecology

## 2022-07-25 ENCOUNTER — Other Ambulatory Visit: Payer: Self-pay | Admitting: Nurse Practitioner

## 2022-07-25 DIAGNOSIS — Z1231 Encounter for screening mammogram for malignant neoplasm of breast: Secondary | ICD-10-CM

## 2022-08-07 DIAGNOSIS — Z780 Asymptomatic menopausal state: Secondary | ICD-10-CM | POA: Diagnosis not present

## 2022-08-07 DIAGNOSIS — Z6837 Body mass index (BMI) 37.0-37.9, adult: Secondary | ICD-10-CM | POA: Diagnosis not present

## 2022-08-07 DIAGNOSIS — Z130 Encounter for screening for diseases of the blood and blood-forming organs and certain disorders involving the immune mechanism: Secondary | ICD-10-CM | POA: Diagnosis not present

## 2022-08-07 DIAGNOSIS — Z01419 Encounter for gynecological examination (general) (routine) without abnormal findings: Secondary | ICD-10-CM | POA: Diagnosis not present

## 2022-08-21 ENCOUNTER — Ambulatory Visit
Admission: RE | Admit: 2022-08-21 | Discharge: 2022-08-21 | Disposition: A | Discharge status: Home / Self Care | Payer: BC Managed Care – PPO | Source: Ambulatory Visit | Attending: Gynecology | Admitting: Gynecology

## 2022-08-21 DIAGNOSIS — Z1231 Encounter for screening mammogram for malignant neoplasm of breast: Secondary | ICD-10-CM

## 2022-08-27 DIAGNOSIS — I10 Essential (primary) hypertension: Secondary | ICD-10-CM | POA: Diagnosis not present

## 2022-09-28 DIAGNOSIS — I10 Essential (primary) hypertension: Secondary | ICD-10-CM | POA: Diagnosis not present

## 2022-10-30 DIAGNOSIS — L821 Other seborrheic keratosis: Secondary | ICD-10-CM | POA: Diagnosis not present

## 2022-10-30 DIAGNOSIS — D2261 Melanocytic nevi of right upper limb, including shoulder: Secondary | ICD-10-CM | POA: Diagnosis not present

## 2022-10-30 DIAGNOSIS — L565 Disseminated superficial actinic porokeratosis (DSAP): Secondary | ICD-10-CM | POA: Diagnosis not present

## 2022-10-30 DIAGNOSIS — L738 Other specified follicular disorders: Secondary | ICD-10-CM | POA: Diagnosis not present

## 2022-12-18 DIAGNOSIS — Z0000 Encounter for general adult medical examination without abnormal findings: Secondary | ICD-10-CM | POA: Diagnosis not present

## 2022-12-18 DIAGNOSIS — E782 Mixed hyperlipidemia: Secondary | ICD-10-CM | POA: Diagnosis not present

## 2022-12-18 DIAGNOSIS — I10 Essential (primary) hypertension: Secondary | ICD-10-CM | POA: Diagnosis not present

## 2023-03-01 DIAGNOSIS — L565 Disseminated superficial actinic porokeratosis (DSAP): Secondary | ICD-10-CM | POA: Diagnosis not present

## 2023-03-01 DIAGNOSIS — L920 Granuloma annulare: Secondary | ICD-10-CM | POA: Diagnosis not present

## 2023-03-01 DIAGNOSIS — L570 Actinic keratosis: Secondary | ICD-10-CM | POA: Diagnosis not present

## 2023-03-01 DIAGNOSIS — D2271 Melanocytic nevi of right lower limb, including hip: Secondary | ICD-10-CM | POA: Diagnosis not present

## 2023-05-01 DIAGNOSIS — L650 Telogen effluvium: Secondary | ICD-10-CM | POA: Diagnosis not present

## 2023-05-07 DIAGNOSIS — L659 Nonscarring hair loss, unspecified: Secondary | ICD-10-CM | POA: Diagnosis not present

## 2023-06-07 DIAGNOSIS — R232 Flushing: Secondary | ICD-10-CM | POA: Diagnosis not present

## 2023-08-02 DIAGNOSIS — L570 Actinic keratosis: Secondary | ICD-10-CM | POA: Diagnosis not present

## 2023-08-02 DIAGNOSIS — L565 Disseminated superficial actinic porokeratosis (DSAP): Secondary | ICD-10-CM | POA: Diagnosis not present

## 2023-08-02 DIAGNOSIS — L281 Prurigo nodularis: Secondary | ICD-10-CM | POA: Diagnosis not present

## 2023-08-02 DIAGNOSIS — L821 Other seborrheic keratosis: Secondary | ICD-10-CM | POA: Diagnosis not present

## 2023-09-26 DIAGNOSIS — Z1231 Encounter for screening mammogram for malignant neoplasm of breast: Secondary | ICD-10-CM | POA: Diagnosis not present

## 2023-11-19 DIAGNOSIS — Z01419 Encounter for gynecological examination (general) (routine) without abnormal findings: Secondary | ICD-10-CM | POA: Diagnosis not present

## 2023-12-24 DIAGNOSIS — F329 Major depressive disorder, single episode, unspecified: Secondary | ICD-10-CM | POA: Diagnosis not present

## 2023-12-24 DIAGNOSIS — R232 Flushing: Secondary | ICD-10-CM | POA: Diagnosis not present

## 2023-12-24 DIAGNOSIS — E785 Hyperlipidemia, unspecified: Secondary | ICD-10-CM | POA: Diagnosis not present

## 2023-12-24 DIAGNOSIS — Z0000 Encounter for general adult medical examination without abnormal findings: Secondary | ICD-10-CM | POA: Diagnosis not present

## 2023-12-24 DIAGNOSIS — D369 Benign neoplasm, unspecified site: Secondary | ICD-10-CM | POA: Diagnosis not present

## 2024-01-14 DIAGNOSIS — L821 Other seborrheic keratosis: Secondary | ICD-10-CM | POA: Diagnosis not present

## 2024-01-14 DIAGNOSIS — L570 Actinic keratosis: Secondary | ICD-10-CM | POA: Diagnosis not present

## 2024-01-14 DIAGNOSIS — L738 Other specified follicular disorders: Secondary | ICD-10-CM | POA: Diagnosis not present

## 2024-01-14 DIAGNOSIS — D485 Neoplasm of uncertain behavior of skin: Secondary | ICD-10-CM | POA: Diagnosis not present

## 2024-01-14 DIAGNOSIS — L720 Epidermal cyst: Secondary | ICD-10-CM | POA: Diagnosis not present

## 2024-01-14 DIAGNOSIS — D225 Melanocytic nevi of trunk: Secondary | ICD-10-CM | POA: Diagnosis not present

## 2024-01-14 DIAGNOSIS — L565 Disseminated superficial actinic porokeratosis (DSAP): Secondary | ICD-10-CM | POA: Diagnosis not present

## 2024-03-02 DIAGNOSIS — D124 Benign neoplasm of descending colon: Secondary | ICD-10-CM | POA: Diagnosis not present

## 2024-03-02 DIAGNOSIS — K5730 Diverticulosis of large intestine without perforation or abscess without bleeding: Secondary | ICD-10-CM | POA: Diagnosis not present

## 2024-03-02 DIAGNOSIS — K635 Polyp of colon: Secondary | ICD-10-CM | POA: Diagnosis not present

## 2024-03-02 DIAGNOSIS — Z860101 Personal history of adenomatous and serrated colon polyps: Secondary | ICD-10-CM | POA: Diagnosis not present

## 2024-03-02 DIAGNOSIS — D123 Benign neoplasm of transverse colon: Secondary | ICD-10-CM | POA: Diagnosis not present

## 2024-03-02 DIAGNOSIS — Z1211 Encounter for screening for malignant neoplasm of colon: Secondary | ICD-10-CM | POA: Diagnosis not present

## 2024-03-02 DIAGNOSIS — Z09 Encounter for follow-up examination after completed treatment for conditions other than malignant neoplasm: Secondary | ICD-10-CM | POA: Diagnosis not present

## 2024-07-29 DIAGNOSIS — L570 Actinic keratosis: Secondary | ICD-10-CM | POA: Diagnosis not present

## 2024-07-29 DIAGNOSIS — L720 Epidermal cyst: Secondary | ICD-10-CM | POA: Diagnosis not present

## 2024-07-29 DIAGNOSIS — L738 Other specified follicular disorders: Secondary | ICD-10-CM | POA: Diagnosis not present

## 2024-08-31 DIAGNOSIS — F410 Panic disorder [episodic paroxysmal anxiety] without agoraphobia: Secondary | ICD-10-CM | POA: Diagnosis not present

## 2024-11-20 ENCOUNTER — Other Ambulatory Visit (HOSPITAL_BASED_OUTPATIENT_CLINIC_OR_DEPARTMENT_OTHER): Payer: Self-pay | Admitting: Gynecology

## 2024-11-20 DIAGNOSIS — Z13820 Encounter for screening for osteoporosis: Secondary | ICD-10-CM
